# Patient Record
Sex: Male | Born: 1975
Health system: Southern US, Community
[De-identification: ages and names within clinical notes are randomized; demographics above are authoritative.]

## PROBLEM LIST (undated history)

## (undated) DIAGNOSIS — F410 Panic disorder [episodic paroxysmal anxiety] without agoraphobia: Secondary | ICD-10-CM

## (undated) HISTORY — DX: Panic disorder (episodic paroxysmal anxiety): F41.0

## (undated) HISTORY — PX: WISDOM TOOTH EXTRACTION: SHX21

---

## 2004-11-19 HISTORY — PX: COLONOSCOPY: SHX174

## 2010-10-05 ENCOUNTER — Ambulatory Visit: Payer: Self-pay | Admitting: Family Medicine

## 2010-10-05 DIAGNOSIS — F410 Panic disorder [episodic paroxysmal anxiety] without agoraphobia: Secondary | ICD-10-CM

## 2010-10-05 HISTORY — DX: Panic disorder (episodic paroxysmal anxiety): F41.0

## 2010-10-11 ENCOUNTER — Encounter: Payer: Self-pay | Admitting: Family Medicine

## 2010-10-15 LAB — CONVERTED CEMR LAB
ALT: 16 units/L (ref 0–53)
AST: 15 units/L (ref 0–37)
Alkaline Phosphatase: 49 units/L (ref 39–117)
Basophils Absolute: 0 10*3/uL (ref 0.0–0.1)
Basophils Relative: 0 % (ref 0–1)
Cholesterol: 158 mg/dL (ref 0–200)
Creatinine, Ser: 0.92 mg/dL (ref 0.40–1.50)
Eosinophils Absolute: 0.3 10*3/uL (ref 0.0–0.7)
Eosinophils Relative: 5 % (ref 0–5)
HCT: 43 % (ref 39.0–52.0)
Hemoglobin: 14.2 g/dL (ref 13.0–17.0)
LDL Cholesterol: 104 mg/dL — ABNORMAL HIGH (ref 0–99)
MCHC: 33 g/dL (ref 30.0–36.0)
Monocytes Absolute: 0.5 10*3/uL (ref 0.1–1.0)
Platelets: 253 10*3/uL (ref 150–400)
RDW: 13.4 % (ref 11.5–15.5)
Sodium: 138 meq/L (ref 135–145)
Total Bilirubin: 0.6 mg/dL (ref 0.3–1.2)
Total CHOL/HDL Ratio: 4.6
Total Protein: 7.1 g/dL (ref 6.0–8.3)
VLDL: 20 mg/dL (ref 0–40)

## 2010-12-19 NOTE — Assessment & Plan Note (Signed)
Summary: NOV: Get estab   Vital Signs:  Patient profile:   35 year old male Height:      75 inches Weight:      199 pounds Pulse rate:   81 / minute BP sitting:   126 / 66  (right arm) Cuff size:   regular  Vitals Entered By: Avon Gully CMA, Duncan Dull) (October 05, 2010 3:33 PM) CC: NP-est care   CC:  NP-est care.  History of Present Illness: Here to get estab.  he did mention that when he lived in the [place] he had an infection on the right side of his arm.  He was put on an antibiotic which he then had an allergic reaction to.  He was told he did have a penicillin allergy.  Initially he had a rash after another day or two he developed shortness of breath.  He literally felt like he was going to die.  Since that time for a missed a year he noted having similar episodes of shortness of breath at night when he would lay down to go to sleep.  He never had a rash.  And says it usually would resolve after a few minutes.  He did have some blood work done at that time which was all normal.  He says the last episode was in July of 2010.he thinks now that this may be anxiety.  Habits & Providers  Alcohol-Tobacco-Diet     Alcohol drinks/day: <1     Tobacco Status: never  Exercise-Depression-Behavior     Does Patient Exercise: no     STD Risk: never     Drug Use: no     Seat Belt Use: always  Past History:  Past Medical History: Colonoscopy 2008 for diarrhea  Past Surgical History: Wisdom Tooth removal.   Contraindications/Deferment of Procedures/Staging:    Test/Procedure: FLU VAX    Reason for deferment: patient declined   Family History: Lyphoma father, dx at age 50, died age 17 [place] with MI Grandparent with DM.   Social History: Production designer, theatre/television/film with [employer].  Married with 2 kids.   Never Smoked Alcohol use-yes, 2 per week.  Drug use-no Regular exercise-no 3 caffeinated drinks  Smoking Status:  never Does Patient Exercise:  no STD Risk:  never Drug Use:   no Seat Belt Use:  always  Review of Systems       No fever/sweats/weakness, unexplained weight loss/gain.  No vison changes.  No difficulty hearing/ringing in ears, hay fever/allergies.  No chest pain/discomfort, palpitations.  No Br lump/nipple discharge.  No cough/wheeze.  No blood in BM, nausea/vomiting/diarrhea.  No nighttime urination, leaking urine, unusual vaginal bleeding, discharge (penis or vagina).  No muscle/joint pain. No rash, change in mole.  No HA, memory loss.  No anxiety, sleep d/o, depression.  No easy bruising/bleeding, unexplained lump   Physical Exam  General:  Well-developed,well-nourished,in no acute distress; alert,appropriate and cooperative throughout examination Neck:  No deformities, masses, or tenderness noted. NO tM.  Lungs:  Normal respiratory effort, chest expands symmetrically. Lungs are clear to auscultation, no crackles or wheezes. Heart:  Normal rate and regular rhythm. S1 and S2 normal without gallop, murmur, click, rub or other extra sounds.   Impression & Recommendations:  Problem # 1:  LYMPHOMA, FAMILY HX (ICD-V16.7)  of note I do think the episodes that he mentioned are anxiety related.  They do not sound allergic.  these episodes resolved a year ago.  I do recommend screening labs and following up for  a complete physical.  Because does have a family history of lymphoma I do recommend a CBC as well.  He declined flu vaccine today.  He reports that his tetanus vaccine is up-to-date. Orders: T-CBC w/Diff ([id number])  Problem # 2:  PANIC ATTACK (ICD-300.01) of note I do think the episodes that he mentioned are anxiety related.  They do not sound allergic.  these episodes resolved a year ago. he is not currently having any problems with mood and anxiety.  Other Orders: T-Comprehensive Metabolic Panel [id number]) T-Lipid Profile [phone])  Patient Instructions: 1)  Feel free to schedule a physical anytime.    Orders Added: 1)   T-Comprehensive Metabolic Panel [[id number]] 2)  T-Lipid Profile [[id number]] 3)  T-CBC w/Diff [[id number]] 4)  New Patient Level II [99202]   Immunization History:  Tetanus/Td Immunization History:    Tetanus/Td:  historical (04/21/2007)   Immunization History:  Tetanus/Td Immunization History:    Tetanus/Td:  Historical (04/21/2007)   Immunization History:  Tetanus/Td Immunization History:    Tetanus/Td:  historical (04/21/2007)

## 2011-07-20 ENCOUNTER — Ambulatory Visit (INDEPENDENT_AMBULATORY_CARE_PROVIDER_SITE_OTHER): Payer: 59 | Admitting: Family Medicine

## 2011-07-20 ENCOUNTER — Encounter: Payer: Self-pay | Admitting: Family Medicine

## 2011-07-20 VITALS — BP 133/72 | HR 92 | Temp 98.1°F | Wt 194.0 lb

## 2011-07-20 DIAGNOSIS — R42 Dizziness and giddiness: Secondary | ICD-10-CM

## 2011-07-20 DIAGNOSIS — R002 Palpitations: Secondary | ICD-10-CM

## 2011-07-20 LAB — CBC WITH DIFFERENTIAL/PLATELET
Basophils Relative: 0 % (ref 0–1)
Eosinophils Absolute: 0.1 10*3/uL (ref 0.0–0.7)
Lymphs Abs: 1.3 10*3/uL (ref 0.7–4.0)
MCH: 28.1 pg (ref 26.0–34.0)
Neutro Abs: 3.1 10*3/uL (ref 1.7–7.7)
Neutrophils Relative %: 63 % (ref 43–77)
Platelets: 242 10*3/uL (ref 150–400)
RBC: 5.16 MIL/uL (ref 4.22–5.81)

## 2011-07-20 LAB — TSH: TSH: 0.869 u[IU]/mL (ref 0.350–4.500)

## 2011-07-20 NOTE — Progress Notes (Signed)
  Subjective:    Patient ID: Robert Sexton, male    DOB: 09/07/1976, 35 y.o.   MRN: [id number]  HPI 2 days of feeling fatigued, lightheaded and some palpitations.  Appears to be in episodes. Yesterday some HA and neck pain.  No fever.  Mybe some mild congestion. No ST.  Didn't take any meds. Woke with HA today. NO CP or SOB. Denies feeling dehydrated. Has been stressed at work and working long hours. When gets the heart racing will sometimes feel like she has to cough and then it will go away.     Review of Systems     Objective:   Physical Exam  Constitutional: He is oriented to person, place, and time. He appears well-developed and well-nourished.  HENT:  Head: Normocephalic and atraumatic.  Right Ear: External ear normal.  Left Ear: External ear normal.  Nose: Nose normal.  Mouth/Throat: Oropharynx is clear and moist.       TMs and canals are clear.   Eyes: Conjunctivae and EOM are normal. Pupils are equal, round, and reactive to light.  Neck: Neck supple. No thyromegaly present.  Cardiovascular: Normal rate and normal heart sounds.   Pulmonary/Chest: Effort normal and breath sounds normal.  Lymphadenopathy:    He has no cervical adenopathy.  Neurological: He is alert and oriented to person, place, and time.  Skin: Skin is warm and dry.  Psychiatric: He has a normal mood and affect.          Assessment & Plan:  Dizziness - Suspect maybe early viral illness. Call if suddenly gets worse or not better in one week. Will check CBC to rule out infection.  EKG shows rate of 89 bpm, NSR, no acute changes. Will rule out thyroid d/o as well. No fever.  Palpitations as well but EKG is reassuring.  Could be stress related. Does have hx of anxiety as well.

## 2011-07-20 NOTE — Patient Instructions (Signed)
We will call you with the lab results. 

## 2011-07-21 LAB — COMPLETE METABOLIC PANEL WITH GFR
ALT: 14 U/L (ref 0–53)
AST: 11 U/L (ref 0–37)
Chloride: 102 mEq/L (ref 96–112)
Creat: 1.02 mg/dL (ref 0.50–1.35)
Sodium: 140 mEq/L (ref 135–145)
Total Bilirubin: 0.8 mg/dL (ref 0.3–1.2)

## 2011-07-23 ENCOUNTER — Telehealth: Payer: Self-pay | Admitting: Family Medicine

## 2011-07-23 NOTE — Telephone Encounter (Signed)
Call pt: Blood count, thyroid and cmp are normal.

## 2011-07-24 ENCOUNTER — Encounter: Payer: Self-pay | Admitting: Family Medicine

## 2011-07-24 NOTE — Telephone Encounter (Signed)
Left message on vm with results  

## 2011-08-13 ENCOUNTER — Encounter: Payer: Self-pay | Admitting: Family Medicine

## 2013-03-11 ENCOUNTER — Encounter: Payer: Self-pay | Admitting: Family Medicine

## 2013-03-11 ENCOUNTER — Ambulatory Visit (INDEPENDENT_AMBULATORY_CARE_PROVIDER_SITE_OTHER): Payer: 59 | Admitting: Family Medicine

## 2013-03-11 VITALS — BP 113/70 | HR 85 | Temp 97.7°F | Wt 195.0 lb

## 2013-03-11 DIAGNOSIS — S161XXA Strain of muscle, fascia and tendon at neck level, initial encounter: Secondary | ICD-10-CM

## 2013-03-11 DIAGNOSIS — S139XXA Sprain of joints and ligaments of unspecified parts of neck, initial encounter: Secondary | ICD-10-CM

## 2013-03-11 NOTE — Progress Notes (Signed)
CC: Robert Sexton is a 37 y.o. male is here for Neck Pain   Subjective: HPI:  Patient presents with complaint of right neck pain. It is localized behind the right ear and radiates slightly down the back of the right neck. it is worse when lying on his right side , when looking quickly to the left and somewhat when looking down quickly. This started on Sunday of this week approximately 24-12 hours after working in the yard for an extended period of time doing strenuous activity. Symptoms are currently mild to moderate in severity, improves with ibuprofen and heat. Nothing else makes better or worse. He denies hearing loss, dizziness, fevers, chills, unintentional weight loss, trouble swallowing, night sweats, swollen lymph nodes, motor or sensory disturbances, nor midline neck tenderness. His father unfortunately passed away in his early 80s due to lymphoma.     Review Of Systems Outlined In HPI  Past Medical History  Diagnosis Date  . PANIC ATTACK 10/05/2010    Qualifier: Diagnosis of  By: Robert Arnold MD, Robert Sexton History  Problem Relation Age of Onset  . Atrial fibrillation Mother 52  . Lymphoma Father 40  . Cancer Father      History  Substance Use Topics  . Smoking status: Never Smoker   . Smokeless tobacco: Not on file  . Alcohol Use: 1.0 oz/week    2 drink(s) per week     Objective: Filed Vitals:   03/11/13 0832  BP: 113/70  Pulse: 85  Temp: 97.7 F (36.5 C)    General: Alert and Oriented, No Acute Distress HEENT: Pupils equal, round, reactive to light. Conjunctivae clear.  External ears unremarkable, canals clear with intact TMs with appropriate landmarks.  Middle ear appears open without effusion on the left and right middle ear with scant condensation. No mastoid tenderness to percussion. Pink inferior turbinates.  Moist mucous membranes, pharynx without inflammation nor lesions.  Neck supple without palpable lymphadenopathy nor abnormal masses. Lungs:  Clear comfortable work of breathing  MSK: Full range of motion and strength of the neck without midline cervical spinous process tenderness. Pain is reproduced with activation of right sternocleidomastoid muscle and with direct palpation of this muscle. Mental Status: No depression, anxiety, nor agitation. Skin: Warm and dry.  Assessment & Plan: Robert Sexton was seen today for neck pain.  Diagnoses and associated orders for this visit:  Cervical muscle strain, initial encounter  Other Orders - Multiple Vitamin (MULTIVITAMIN) tablet; Take 1 tablet by mouth daily.    Discuss cervical muscle strain diagnoses with patient and treatment including nonsteroidal anti-inflammatories and or muscle relaxers paired With range of motion exercises and continue heat therapy with or without massages. Patient declines prescription strength medication at this time will call if not improving closer to end of week.  Return if symptoms worsen or fail to improve.

## 2013-05-04 ENCOUNTER — Encounter: Payer: Self-pay | Admitting: Family Medicine

## 2013-05-04 ENCOUNTER — Ambulatory Visit (INDEPENDENT_AMBULATORY_CARE_PROVIDER_SITE_OTHER): Payer: 59 | Admitting: Family Medicine

## 2013-05-04 VITALS — BP 131/74 | HR 71 | Ht 77.0 in | Wt 196.0 lb

## 2013-05-04 DIAGNOSIS — Z131 Encounter for screening for diabetes mellitus: Secondary | ICD-10-CM

## 2013-05-04 DIAGNOSIS — Z13220 Encounter for screening for lipoid disorders: Secondary | ICD-10-CM

## 2013-05-04 DIAGNOSIS — Z130 Encounter for screening for diseases of the blood and blood-forming organs and certain disorders involving the immune mechanism: Secondary | ICD-10-CM

## 2013-05-04 DIAGNOSIS — Z0000 Encounter for general adult medical examination without abnormal findings: Secondary | ICD-10-CM

## 2013-05-04 DIAGNOSIS — L819 Disorder of pigmentation, unspecified: Secondary | ICD-10-CM

## 2013-05-04 LAB — BASIC METABOLIC PANEL WITH GFR
Calcium: 9.2 mg/dL (ref 8.4–10.5)
Creat: 0.94 mg/dL (ref 0.50–1.35)
GFR, Est African American: >89 mL/min
GFR, Est Non African American: >89 mL/min

## 2013-05-04 LAB — CBC WITH DIFFERENTIAL/PLATELET
Basophils Absolute: 0 10*3/uL (ref 0.0–0.1)
Eosinophils Relative: 4 % (ref 0–5)
Lymphocytes Relative: 33 % (ref 12–46)
Neutro Abs: 2.1 10*3/uL (ref 1.7–7.7)
Neutrophils Relative %: 50 % (ref 43–77)
Platelets: 225 10*3/uL (ref 150–400)
RDW: 13.3 % (ref 11.5–15.5)
WBC: 4.2 10*3/uL (ref 4.0–10.5)

## 2013-05-04 LAB — LIPID PANEL
HDL: 37 mg/dL — ABNORMAL LOW (ref >39)
LDL Cholesterol: 107 mg/dL — ABNORMAL HIGH (ref 0–99)
Triglycerides: 104 mg/dL (ref <150)
VLDL: 21 mg/dL (ref 0–40)

## 2013-05-04 NOTE — Progress Notes (Signed)
CC: Robert Sexton is a 37 y.o. male is here for Annual Exam   Subjective: HPI:  Colonoscopy: had normal colonosocpy five years ago due to "stomach issues" Prostate:no family hx prostate cancer, will start screening age 58  Influenza Vaccine: out of season Pneumovax: no indication yet Td/Tdap: Td given 2008 UTD Zoster: (Start 37 yo)  Patient presents for complete physical exam without any acute complaints  Over the past 2 weeks have you been bothered by: - Little interest or pleasure in doing things: no - Feeling down depressed or hopeless: no  No formal exercise routine, tries to stay active on a daily basis. He sticks to a varied diet with vegetables. Rare alcohol use, no tobacco use, no recreational drug use  Review of Systems - General ROS: negative for - chills, fever, night sweats, weight gain or weight loss Ophthalmic ROS: negative for - decreased vision Psychological ROS: negative for - anxiety or depression ENT ROS: negative for - hearing change, nasal congestion, tinnitus or allergies Hematological and Lymphatic ROS: negative for - bleeding problems, bruising or swollen lymph nodes Breast ROS: negative Respiratory ROS: no cough, shortness of breath, or wheezing Cardiovascular ROS: no chest pain or dyspnea on exertion Gastrointestinal ROS: no abdominal pain, change in bowel habits, or black or bloody stools Genito-Urinary ROS: negative for - genital discharge, genital ulcers, incontinence or abnormal bleeding from genitals Musculoskeletal ROS: negative for - joint pain or muscle pain Neurological ROS: negative for - headaches or memory loss Dermatological ROS: negative for lumps, mole changes, rash and skin lesion changes  Past Medical History  Diagnosis Date  . PANIC ATTACK 10/05/2010    Qualifier: Diagnosis of  By: Linford Arnold MD, Aurora Mask History  Problem Relation Age of Onset  . Atrial fibrillation Mother 85  . Lymphoma Father 53  . Cancer Father      History  Substance Use Topics  . Smoking status: Never Smoker   . Smokeless tobacco: Not on file  . Alcohol Use: 1.0 oz/week    2 drink(s) per week     Objective: Filed Vitals:   05/04/13 0839  BP: 131/74  Pulse: 71    General: No Acute Distress HEENT: Atraumatic, normocephalic, conjunctivae normal without scleral icterus.  No nasal discharge, hearing grossly intact, TMs with good landmarks bilaterally with no middle ear abnormalities, posterior pharynx clear without oral lesions. Neck: Supple, trachea midline, no cervical nor supraclavicular adenopathy. Pulmonary: Clear to auscultation bilaterally without wheezing, rhonchi, nor rales. Cardiac: Regular rate and rhythm.  No murmurs, rubs, nor gallops. No peripheral edema.  2+ peripheral pulses bilaterally. Abdomen: Bowel sounds normal.  No masses.  Non-tender without rebound.  Negative Murphy's sign. GU:  Bilateral descended non-tender testicles MSK: Grossly intact, no signs of weakness.  Full strength throughout upper and lower extremities.  Full ROM in upper and lower extremities.  No midline spinal tenderness. Neuro: Gait unremarkable, CN II-XII grossly intact.  C5-C6 Reflex 2/4 Bilaterally, L4 Reflex 2/4 Bilaterally.  Cerebellar function intact. Skin: No rashes. Pigmented lesion on the abdomen about the size of an eraser head, heterogeneous color Psych: Alert and oriented to person/place/time.  Thought process normal. No anxiety/depression. Assessment & Plan: [name] was seen today for annual exam.  Diagnoses and associated orders for this visit:  Annual physical exam  Lipid screening - Lipid panel  Screening for diabetes mellitus - BASIC METABOLIC PANEL WITH GFR  Screening for deficiency anemia - CBC w/Diff  Pigmented skin lesion  Healthy lifestyle interventions including but limited to regular exercise, a healthy low fat diet, moderation of salt intake, the dangers of tobacco/alcohol/recreational drug use,  nutrition supplementation, and accident avoidance were discussed with the patient and a handout was provided for future reference. Discussed self testicular exam  Screen for hyperlipidemia, diabetes, given history of lymphoma in the family checking blood lines.    Return in about 4 weeks (around 06/01/2013) for mole biopsy.

## 2013-05-04 NOTE — Patient Instructions (Addendum)
Self-Exam Of The Testicles Young men ages 80-35 are the ones who most commonly get cancer of the testicles. About 300 young men die of this disease each year. Testicular cancer does occur in middle-aged or older men but to a lesser extent. This cancer can almost always be cured if it is found before it gets bad and if it is treated. WORDS TO KNOW:  Testicles are the 2 egg-shaped glands that make hormones and sperm in men.  The scrotum is the skin around testicles.  Epididymis is the rope-like part that is behind and above each testis. It collects sperm made by the testis. WHICH MEN ARE AT HIGH RISK FOR CANCER OF THE TESTICLES?  Men between ages 70 to 52.  Men who are Caucasian.  Men who were born with a testicle that had not moved down into the scrotum.  Men whose testicles have gotten smaller because of an infection.  Men whose fathers or brothers have had cancer of the testicles. SYMPTOMS OF TESTICULAR CANCER  A painless swelling in one of your testicles.  A hard lump. Some lumps may be an infection.  A heavy feeling in your testicles.  An ache in your lower belly (abdomen) or groin. HOW OFTEN SHOULD I CHECK MY TESTICLES? You should check your testicles every month. HOW SHOULD I DO THIS CHECK? It is best to check your testicles right after a warm shower or bath.  Look at your testicles for any swelling. You may need to use a mirror.  Use both your hands to roll each testicle between your thumb and fingers. Feel for any lumps or changes in the size of the testicle. Press firmly. You may find that one testicle is a little bigger than the other. This is normal.  Next, check the epididymis on each testicle. This is the part where most testicular cancers happen. It is normal for the epididymis to feel soft and uneven. When you get used to how your epididymis feels, you will be able to tell if there is any change.  WHAT IF I FIND ANY SWELLINGS OR LUMPS?  Call your doctor. Some lumps  may be an infection. If the lump or swelling is a cancer, it can be treated before it gets worse.  Document Released: 02/01/2009 Document Revised: 01/28/2012 Document Reviewed: 02/01/2009 [facility] Patient Information 2014 [place], [place].   Dr. Genelle Bal General Advice Following Your Complete Physical Exam  The Benefits of Regular Exercise: Unless you suffer from an uncontrolled cardiovascular condition, studies strongly suggest that regular exercise and physical activity will add to both the quality and length of your life.  The World Health Organization recommends 150 minutes of moderate intensity aerobic activity every week.  This is best split over 3-4 days a week, and can be as simple as a brisk walk for just over 35 minutes "most days of the week".  This type of exercise has been shown to lower LDL-Cholesterol, lower average blood sugars, lower blood pressure, lower cardiovascular disease risk, improve memory, and increase one's overall sense of wellbeing.  The addition of anaerobic (or "strength training") exercises offers additional benefits including but not limited to increased metabolism, prevention of osteoporosis, and improved overall cholesterol levels.  How Can I Strive For A Low-Fat Diet?: Current guidelines recommend that 25-35 percent of your daily energy (food) intake should come from fats.  One might ask how can this be achieved without having to dissect each meal on a daily basis?  Switch to skim or 1%  milk instead of whole milk.  Focus on lean meats such as ground [place], fresh fish, baked chicken, and lean cuts of beef as your source of dietary protein.  Consume less than 300mg /day of dietary cholesterol.  Limit trans fatty acid consumption primarily by limiting synthetic trans fats such as partially hydrogenated oils (Ex: fried fast foods).  Focus efforts on reducing your intake of "solid" fats (Ex: Butter).  Substitute olive or vegetable oil for solid fats where  possible.  Moderation of Salt Intake: Provided you don't carry a diagnosis of congestive heart failure nor renal failure, I recommend a daily allowance of no more than 2300 mg of salt (sodium).  Keeping under this daily goal is associated with a decreased risk of cardiovascular events, creeping above it can lead to elevated blood pressures and increases your risk of cardiovascular events.  Milligrams (mg) of salt is listed on all nutrition labels, and your daily intake can add up faster than you think.  Most canned and frozen dinners can pack in over half your daily salt allowance in one meal.    Lifestyle Health Risks: Certain lifestyle choices carry specific health risks.  As you may already know, tobacco use has been associated with increasing one's risk of cardiovascular disease, pulmonary disease, numerous cancers, among many other issues.  What you may not know is that there are medications and nicotine replacement strategies that can more than double your chances of successfully quitting.  I would be thrilled to help manage your quitting strategy if you currently use tobacco products.  When it comes to alcohol use, I've yet to find an "ideal" daily allowance.  Provided an individual does not have a medical condition that is exacerbated by alcohol consumption, general guidelines determine "safe drinking" as no more than two standard drinks for a man or no more than one standard drink for a male per day.  However, much debate still exists on whether any amount of alcohol consumption is technically "safe".  My general advice, keep alcohol consumption to a minimum for general health promotion.  If you or others believe that alcohol, tobacco, or recreational drug use is interfering with your life, I would be happy to provide confidential counseling regarding treatment options.  General "Over The Counter" Nutrition Advice: Postmenopausal women should aim for a daily calcium intake of 1200 mg, however a  significant portion of this might already be provided by diets including milk, yogurt, cheese, and other dairy products.  Vitamin D has been shown to help preserve bone density, prevent fatigue, and has even been shown to help reduce falls in the elderly.  Ensuring a daily intake of 800 Units of Vitamin D is a good place to start to enjoy the above benefits, we can easily check your Vitamin D level to see if you'd potentially benefit from supplementation beyond 800 Units a day.  Folic Acid intake should be of particular concern to women of childbearing age.  Daily consumption of 400-800 mcg of Folic Acid is recommended to minimize the chance of spinal cord defects in a fetus should pregnancy occur.    For many adults, accidents still remain one of the most common culprits when it comes to cause of death.  Some of the simplest but most effective preventitive habits you can adopt include regular seatbelt use, proper helmet use, securing firearms, and regularly testing your smoke and carbon monoxide detectors.  Mozell Hardacre [facility] DO Med [facility] [address], [address] [place], [place] [address]  Phone: [phone]

## 2013-06-23 ENCOUNTER — Encounter: Payer: Self-pay | Admitting: Family Medicine

## 2013-06-23 ENCOUNTER — Ambulatory Visit (INDEPENDENT_AMBULATORY_CARE_PROVIDER_SITE_OTHER): Payer: 59 | Admitting: Family Medicine

## 2013-06-23 VITALS — BP 131/66 | HR 68 | Wt 197.0 lb

## 2013-06-23 DIAGNOSIS — L819 Disorder of pigmentation, unspecified: Secondary | ICD-10-CM

## 2013-06-23 NOTE — Progress Notes (Signed)
CC: Robert Sexton is a 37 y.o. male is here for Biopsy   Subjective: HPI:  Patient complains of a pigmented skin lesion on the midline of the abdomen has been present for 2-3 years he believes it's slowly growing he has no personal history or family history of skin cancer. He denies unintentional weight gain, night sweats, myalgias, or pulmonary complaints.   Review Of Systems Outlined In HPI  Past Medical History  Diagnosis Date  . PANIC ATTACK 10/05/2010    Qualifier: Diagnosis of  By: Linford Arnold MD, Aurora Mask History  Problem Relation Age of Onset  . Atrial fibrillation Mother 63  . Lymphoma Father 28  . Cancer Father      History  Substance Use Topics  . Smoking status: Never Smoker   . Smokeless tobacco: Not on file  . Alcohol Use: 1.0 oz/week    2 drink(s) per week     Objective: Filed Vitals:   06/23/13 0832  BP: 131/66  Pulse: 68    Vital signs reviewed. General: Alert and Oriented, No Acute Distress HEENT: Pupils equal, round, reactive to light. Conjunctivae clear.  External ears unremarkable.  Moist mucous membranes. Lungs: Clear and comfortable work of breathing, speaking in full sentences without accessory muscle use. Cardiac: Regular rate and rhythm.  Neuro: CN II-XII grossly intact, gait normal. Extremities: No peripheral edema.  Strong peripheral pulses.  Mental Status: No depression, anxiety, nor agitation. Logical though process. Skin: Warm and dry. Just above the navel there is a 6 mm homogenously colored pigmented skin lesion with ill-defined borders that is symmetric.  Assessment & Plan: Robert Sexton was seen today for biopsy.  Diagnoses and associated orders for this visit:  Pigmented skin lesion    Punch Biopsy Procedure Note  Pre-operative Diagnosis: Suspicious lesion  Post-operative Diagnosis: same  Locations:above navel  Indications: rule out melanoma  Anesthesia: 1% lidocaine with epinephrine  Procedure Details   History of allergy to iodine: no Patient informed of the risks (including bleeding and infection) and benefits of the  procedure and Verbal informed consent obtained.  The lesion and surrounding area was given a sterile prep using betadyne and draped in the usual sterile fashion. The skin was then stretched perpendicular to the skin tension lines and the lesion removed using the 6mm punch. The resulting ellipse was then closed. The wound was closed with Figure 8 Nylon 3-0.  a sterile dressing applied. The specimen was sent for pathologic examination. The patient tolerated the procedure well.  EBL: 1 ml  Findings: unremarkable  Condition: Stable  Complications: none.  Plan: 1. Instructed to keep the wound dry and covered for 24-48h and clean thereafter. 2. Warning signs of infection were reviewed.   3. Recommended that the patient use OTC analgesics as needed for pain.  4. Return for suture removal in 1 week.  Return in about 1 week (around 06/30/2013).

## 2013-06-23 NOTE — Addendum Note (Signed)
Addended by: Wyline Beady on: 06/23/2013 09:11 AM   Modules accepted: Orders

## 2013-06-25 ENCOUNTER — Telehealth: Payer: Self-pay | Admitting: Family Medicine

## 2013-06-25 DIAGNOSIS — D239 Other benign neoplasm of skin, unspecified: Secondary | ICD-10-CM | POA: Insufficient documentation

## 2013-06-25 NOTE — Telephone Encounter (Signed)
Left message to call back  

## 2013-06-25 NOTE — Telephone Encounter (Signed)
[name], Will you please let Robert Sexton know that the pathologist reports that his biopsy is not melanoma but is a collection of cells that have the potential to evolve into a melanoma.  The pathologist has recommended complete excision of all the pigmented tissue from the biopsy site.  I've placed a non-emergent referral to our dermatology colleagues for a complete excision.  I doubt much more skin needs to be removed but a dermatologist will provide him with a better cosmetic result.  Still come back sometime mid-late next week for suture removal unless he sees them first.

## 2013-06-25 NOTE — Telephone Encounter (Signed)
Pt.notified

## 2013-06-30 ENCOUNTER — Ambulatory Visit (INDEPENDENT_AMBULATORY_CARE_PROVIDER_SITE_OTHER): Payer: 59 | Admitting: Family Medicine

## 2013-06-30 ENCOUNTER — Encounter: Payer: Self-pay | Admitting: Family Medicine

## 2013-06-30 VITALS — BP 114/69 | HR 72 | Wt 198.0 lb

## 2013-06-30 DIAGNOSIS — D239 Other benign neoplasm of skin, unspecified: Secondary | ICD-10-CM

## 2013-06-30 DIAGNOSIS — Z4802 Encounter for removal of sutures: Secondary | ICD-10-CM

## 2013-06-30 NOTE — Progress Notes (Signed)
CC: Jawara Latorre is a 37 y.o. male is here for Suture / Staple Removal   Subjective: HPI:  Patient returns 8 days out after removal of dysplastic nevus he has had no bleeding discharge or discomfort at the site of the punch biopsy. He denies fevers, chills or any complaints today.   Review Of Systems Outlined In HPI  Past Medical History  Diagnosis Date  . PANIC ATTACK 10/05/2010    Qualifier: Diagnosis of  By: Linford Arnold MD, [place] [facility] History  Problem Relation Age of Onset  . Atrial fibrillation Mother 78  . Lymphoma Father 53  . Cancer Father      History  Substance Use Topics  . Smoking status: Never Smoker   . Smokeless tobacco: Not on file  . Alcohol Use: 1.0 oz/week    2 drink(s) per week     Objective: Filed Vitals:   06/30/13 0819  BP: 114/69  Pulse: 72    On the abdomen Suture site is clean dry and intact the 2 edges are well approximated there is no discharge or tenderness  Assessment & Plan: Patrick was seen today for suture / staple removal.  Diagnoses and associated orders for this visit:  Visit for suture removal  Dysplastic nevus    Single figure-of-eight stitch was easily removed without complications, patient confirms complete excision visit is scheduled in early September he was given pathology report to share at that visit just in case it was not sent with referral.  Return if symptoms worsen or fail to improve.

## 2013-07-23 ENCOUNTER — Other Ambulatory Visit: Payer: Self-pay | Admitting: Dermatology

## 2013-08-05 ENCOUNTER — Encounter: Payer: Self-pay | Admitting: Family Medicine

## 2014-07-20 ENCOUNTER — Ambulatory Visit (INDEPENDENT_AMBULATORY_CARE_PROVIDER_SITE_OTHER): Payer: 59 | Admitting: Family Medicine

## 2014-07-20 ENCOUNTER — Encounter: Payer: Self-pay | Admitting: Family Medicine

## 2014-07-20 VITALS — BP 125/72 | HR 81 | Ht 75.0 in | Wt 197.0 lb

## 2014-07-20 DIAGNOSIS — Z0000 Encounter for general adult medical examination without abnormal findings: Secondary | ICD-10-CM

## 2014-07-20 DIAGNOSIS — R002 Palpitations: Secondary | ICD-10-CM | POA: Insufficient documentation

## 2014-07-20 DIAGNOSIS — Z23 Encounter for immunization: Secondary | ICD-10-CM

## 2014-07-20 NOTE — Progress Notes (Signed)
Subjective:    Patient ID: Robert Sexton, male    DOB: [birth year], 38 y.o.   MRN: [id number]  HPI Here for CPE today.  No specific concerns today.   He does complain of some occasional palpitations or skipping of heartbeat. He says there is no certain time of day. Has been in the evening or at work. Does not necessarily with activity. He does notice a little bit more when he is stressed. He says usually a seconds and sometimes it can last up to 10 minutes but that's not often. He usually experiences his symptoms about 2-3 times per week. He denies any dizziness/lightheadedness, nausea vomiting or chest pain with this. He says his mom has a history of palpitations.   Review of Systems Comprehensive ROS is neg.    BP 125/72  Pulse 81  Ht 6\' 3"  (1.905 m)  Wt 197 lb (89.359 kg)  BMI 24.62 kg/m2    Allergies  Allergen Reactions  . Penicillins Anaphylaxis    Past Medical History  Diagnosis Date  . PANIC ATTACK 10/05/2010    Qualifier: Diagnosis of  By: Madilyn Fireman MD, Barnetta Chapel      Past Surgical History  Procedure Laterality Date  . Wisdom tooth extraction      History   Social History  . Marital Status: Married    Spouse Name: N/A    Number of Children: N/A  . Years of Education: N/A   Occupational History  . Not on file.   Social History Main Topics  . Smoking status: Never Smoker   . Smokeless tobacco: Not on file  . Alcohol Use: 1.0 oz/week    2 drink(s) per week  . Drug Use: No  . Sexual Activity: Not on file   Other Topics Concern  . Not on file   Social History Narrative  . No narrative on file    Family History  Problem Relation Age of Onset  . Atrial fibrillation Mother 64  . Lymphoma Father 71  . Cancer Father     Outpatient Encounter Prescriptions as of 07/20/2014  Medication Sig  . Multiple Vitamin (MULTIVITAMIN) tablet Take 1 tablet by mouth daily.          Objective:   Physical Exam  Constitutional: He is oriented to person, place,  and time. He appears well-developed and well-nourished.  HENT:  Head: Normocephalic and atraumatic.  Right Ear: External ear normal.  Left Ear: External ear normal.  Nose: Nose normal.  Mouth/Throat: Oropharynx is clear and moist.  Eyes: Conjunctivae and EOM are normal. Pupils are equal, round, and reactive to light.  Neck: Normal range of motion. Neck supple. No thyromegaly present.  Cardiovascular: Normal rate, regular rhythm, normal heart sounds and intact distal pulses.   Pulmonary/Chest: Effort normal and breath sounds normal.  Abdominal: Soft. Bowel sounds are normal. He exhibits no distension and no mass. There is no tenderness. There is no rebound and no guarding.  Musculoskeletal: Normal range of motion.  Lymphadenopathy:    He has no cervical adenopathy.  Neurological: He is alert and oriented to person, place, and time. He has normal reflexes.  Skin: Skin is warm and dry.  Psychiatric: He has a normal mood and affect. His behavior is normal. Judgment and thought content normal.          Assessment & Plan:  CPE  Keep up a regular exercise program and make sure you are eating a healthy diet Try to eat 4 servings of dairy a  day, or if you are lactose intolerant take a calcium with vitamin D daily.  Your vaccines are up to date.  Check CBC bc of father's history of lymphoma.   Palpitations- will check CBC, TSH.  Will get EKG. Had similar sx 3 years ago. We discussed possibly getting a Holter monitor to evaluate his symptoms to confirm if it could possibly be PVCs. It sounds most consistent with this diagnosis. We could certainly consider a trial of beta blocker help control his symptoms. He says he'll be traveling a lot over the next month this is likely coming back in October to schedule the Holter monitor.  EKG shows rate of 80 beats per minute, normal sinus rhythm with no acute changes and normal axis. Questionable Q wave in lead V2. Otherwise no significant changes from  2012.   Flu shot given

## 2014-07-20 NOTE — Patient Instructions (Signed)
Please call me when you get back from traveling and we can set up a Holter monitor for you. Keep up a regular exercise program and make sure you are eating a healthy diet Try to eat 4 servings of dairy a day, or if you are lactose intolerant take a calcium with vitamin D daily.  Your vaccines are up to date.

## 2014-07-21 LAB — COMPLETE METABOLIC PANEL WITH GFR
ALBUMIN: 4.4 g/dL (ref 3.5–5.2)
ALK PHOS: 47 U/L (ref 39–117)
ALT: 26 U/L (ref 0–53)
AST: 20 U/L (ref 0–37)
BUN: 10 mg/dL (ref 6–23)
CO2: 29 mEq/L (ref 19–32)
Calcium: 9.5 mg/dL (ref 8.4–10.5)
Chloride: 102 mEq/L (ref 96–112)
Creat: 0.97 mg/dL (ref 0.50–1.35)
GFR, Est African American: >89 mL/min
GFR, Est Non African American: >89 mL/min
GLUCOSE: 82 mg/dL (ref 70–99)
POTASSIUM: 4.7 meq/L (ref 3.5–5.3)
SODIUM: 138 meq/L (ref 135–145)
TOTAL PROTEIN: 7.3 g/dL (ref 6.0–8.3)
Total Bilirubin: 0.6 mg/dL (ref 0.2–1.2)

## 2014-07-21 LAB — LIPID PANEL
CHOL/HDL RATIO: 3.8 ratio
Cholesterol: 151 mg/dL (ref 0–200)
HDL: 40 mg/dL (ref >39)
LDL Cholesterol: 94 mg/dL (ref 0–99)
Triglycerides: 87 mg/dL (ref <150)
VLDL: 17 mg/dL (ref 0–40)

## 2014-07-21 LAB — CBC WITH DIFFERENTIAL/PLATELET
BASOS ABS: 0 10*3/uL (ref 0.0–0.1)
BASOS PCT: 1 % (ref 0–1)
Eosinophils Absolute: 0.2 10*3/uL (ref 0.0–0.7)
Eosinophils Relative: 4 % (ref 0–5)
HCT: 43.5 % (ref 39.0–52.0)
HEMOGLOBIN: 14.5 g/dL (ref 13.0–17.0)
Lymphocytes Relative: 28 % (ref 12–46)
Lymphs Abs: 1.2 10*3/uL (ref 0.7–4.0)
MCH: 28.3 pg (ref 26.0–34.0)
MCHC: 33.3 g/dL (ref 30.0–36.0)
MCV: 84.8 fL (ref 78.0–100.0)
MONOS PCT: 10 % (ref 3–12)
Monocytes Absolute: 0.4 10*3/uL (ref 0.1–1.0)
NEUTROS ABS: 2.5 10*3/uL (ref 1.7–7.7)
NEUTROS PCT: 57 % (ref 43–77)
Platelets: 257 10*3/uL (ref 150–400)
RBC: 5.13 MIL/uL (ref 4.22–5.81)
RDW: 13.4 % (ref 11.5–15.5)
WBC: 4.4 10*3/uL (ref 4.0–10.5)

## 2014-07-21 LAB — TSH: TSH: 1.266 u[IU]/mL (ref 0.350–4.500)

## 2014-07-21 NOTE — Progress Notes (Signed)
Quick Note:  All labs are normal. ______ 

## 2014-07-22 ENCOUNTER — Telehealth: Payer: Self-pay | Admitting: *Deleted

## 2014-07-22 NOTE — Telephone Encounter (Signed)
Pt's biometric screening form completed, faxed and scanned.Robert Sexton [place]

## 2015-01-03 ENCOUNTER — Ambulatory Visit (INDEPENDENT_AMBULATORY_CARE_PROVIDER_SITE_OTHER): Payer: 59 | Admitting: Family Medicine

## 2015-01-03 ENCOUNTER — Encounter: Payer: Self-pay | Admitting: Family Medicine

## 2015-01-03 VITALS — BP 126/72 | HR 75 | Wt 201.0 lb

## 2015-01-03 DIAGNOSIS — R002 Palpitations: Secondary | ICD-10-CM

## 2015-01-03 NOTE — Progress Notes (Signed)
   Subjective:    Patient ID: Robert Sexton, male    DOB: 1976/03/11, 39 y.o.   MRN: [id number]  HPI C/o Palpitations - says can last seconds. Sometimes coughing helps it go away.  Says can happen at any time.  Stress does seem to trigger it but can happen at any time.  Now happening daily.  Back in September was only happening 2-3 times a week. Does feels like he has high anxiety.  He said years ago he actually developed pain attacks when he went overseas. He says he really hasn't had any palms with it until about a week ago but he has been under a lot of stress at his job. No chest pain or breathing problems.  Review of Systems     Objective:   Physical Exam  Constitutional: He is oriented to person, place, and time. He appears well-developed and well-nourished.  HENT:  Head: Normocephalic and atraumatic.  Cardiovascular: Normal rate, regular rhythm and normal heart sounds.   Pulmonary/Chest: Effort normal and breath sounds normal.  Neurological: He is alert and oriented to person, place, and time.  Skin: Skin is warm and dry.  Psychiatric: He has a normal mood and affect. His behavior is normal.          Assessment & Plan:   Palpitations-recommend heart monitor for further evaluation. We did an EKG and lab work back in the fall to rule out any abnormalities that may be causing his symptoms. It does sound like the frequency has increased. We'll call results once available.

## 2015-01-11 ENCOUNTER — Encounter: Payer: Self-pay | Admitting: *Deleted

## 2015-01-11 ENCOUNTER — Encounter (INDEPENDENT_AMBULATORY_CARE_PROVIDER_SITE_OTHER): Payer: 59

## 2015-01-11 DIAGNOSIS — R002 Palpitations: Secondary | ICD-10-CM | POA: Diagnosis not present

## 2015-01-11 NOTE — Progress Notes (Signed)
Patient ID: Robert Sexton, male   DOB: Jan 20, 1976, 39 y.o.   MRN: [id number] [employer] 30 day cardiac event monitor applied to patient.

## 2015-02-21 ENCOUNTER — Telehealth: Payer: Self-pay | Admitting: Emergency Medicine

## 2015-09-19 ENCOUNTER — Ambulatory Visit (INDEPENDENT_AMBULATORY_CARE_PROVIDER_SITE_OTHER): Payer: 59 | Admitting: Family Medicine

## 2015-09-19 ENCOUNTER — Encounter: Payer: Self-pay | Admitting: Family Medicine

## 2015-09-19 VITALS — BP 112/65 | HR 87 | Temp 98.5°F | Ht 75.0 in | Wt 198.0 lb

## 2015-09-19 DIAGNOSIS — Z0000 Encounter for general adult medical examination without abnormal findings: Secondary | ICD-10-CM

## 2015-09-19 DIAGNOSIS — J069 Acute upper respiratory infection, unspecified: Secondary | ICD-10-CM

## 2015-09-19 DIAGNOSIS — Z114 Encounter for screening for human immunodeficiency virus [HIV]: Secondary | ICD-10-CM | POA: Diagnosis not present

## 2015-09-19 DIAGNOSIS — Z23 Encounter for immunization: Secondary | ICD-10-CM

## 2015-09-19 DIAGNOSIS — R002 Palpitations: Secondary | ICD-10-CM | POA: Diagnosis not present

## 2015-09-19 LAB — CBC WITH DIFFERENTIAL/PLATELET
BASOS ABS: 0 10*3/uL (ref 0.0–0.1)
BASOS PCT: 0 % (ref 0–1)
EOS ABS: 0.1 10*3/uL (ref 0.0–0.7)
Eosinophils Relative: 2 % (ref 0–5)
HCT: 41.6 % (ref 39.0–52.0)
Hemoglobin: 13.8 g/dL (ref 13.0–17.0)
LYMPHS PCT: 23 % (ref 12–46)
Lymphs Abs: 1.5 10*3/uL (ref 0.7–4.0)
MCH: 27.8 pg (ref 26.0–34.0)
MCHC: 33.2 g/dL (ref 30.0–36.0)
MCV: 83.7 fL (ref 78.0–100.0)
MPV: 9.3 fL (ref 8.6–12.4)
Monocytes Absolute: 0.7 10*3/uL (ref 0.1–1.0)
Monocytes Relative: 11 % (ref 3–12)
NEUTROS ABS: 4.2 10*3/uL (ref 1.7–7.7)
Neutrophils Relative %: 64 % (ref 43–77)
PLATELETS: 281 10*3/uL (ref 150–400)
RBC: 4.97 MIL/uL (ref 4.22–5.81)
RDW: 13.1 % (ref 11.5–15.5)
WBC: 6.6 10*3/uL (ref 4.0–10.5)

## 2015-09-19 LAB — COMPLETE METABOLIC PANEL WITH GFR
ALBUMIN: 4.3 g/dL (ref 3.6–5.1)
ALK PHOS: 60 U/L (ref 40–115)
ALT: 16 U/L (ref 9–46)
AST: 14 U/L (ref 10–40)
BILIRUBIN TOTAL: 0.6 mg/dL (ref 0.2–1.2)
BUN: 9 mg/dL (ref 7–25)
CALCIUM: 9.3 mg/dL (ref 8.6–10.3)
CO2: 28 mmol/L (ref 20–31)
Chloride: 101 mmol/L (ref 98–110)
Creat: 0.91 mg/dL (ref 0.60–1.35)
GFR, Est African American: >89 mL/min (ref >=60)
GLUCOSE: 85 mg/dL (ref 65–99)
Potassium: 4.5 mmol/L (ref 3.5–5.3)
SODIUM: 139 mmol/L (ref 135–146)
TOTAL PROTEIN: 7.1 g/dL (ref 6.1–8.1)

## 2015-09-19 LAB — LIPID PANEL
Cholesterol: 146 mg/dL (ref 125–200)
HDL: 31 mg/dL — AB (ref >=40)
LDL Cholesterol: 101 mg/dL (ref <130)
Total CHOL/HDL Ratio: 4.7 Ratio (ref <=5.0)
Triglycerides: 69 mg/dL (ref <150)
VLDL: 14 mg/dL (ref <30)

## 2015-09-19 NOTE — Progress Notes (Signed)
Subjective:    Patient ID: Robert Sexton, male    DOB: 01-08-1976, 39 y.o.   MRN: [id number]  HPI Here for complete physical. He has no specific complaints.  He is doing well. Has had some stress with work as he has been traveling more.  He has had a cold for one week.  Cough and congestion. No fever. Using sudafed and decongestant and has been feeling some better the last 3 days.   Palpitations - he anatomy that we had discussed using the beta blocker before. Was noted can be used more as needed instead of daily and wanted at this was a possible option.  He just takes a multivitamin daily.  Review of Systems  Comprehensive ROS is negative.   There were no vitals taken for this visit.    Allergies  Allergen Reactions  . Penicillins Anaphylaxis    Past Medical History  Diagnosis Date  . PANIC ATTACK 10/05/2010    Qualifier: Diagnosis of  By: Madilyn Fireman MD, Barnetta Chapel      Past Surgical History  Procedure Laterality Date  . Wisdom tooth extraction      Social History   Social History  . Marital Status: Married    Spouse Name: N/A  . Number of Children: 2  . Years of Education: N/A   Occupational History  . Not on file.   Social History Main Topics  . Smoking status: Never Smoker   . Smokeless tobacco: Not on file  . Alcohol Use: 1.0 oz/week    2 drink(s) per week  . Drug Use: No  . Sexual Activity: Not on file   Other Topics Concern  . Not on file   Social History Narrative   Play bball sometimes for exercise. Has 2 sons      Family History  Problem Relation Age of Onset  . Atrial fibrillation Mother 109  . Lymphoma Father 19    Outpatient Encounter Prescriptions as of 09/19/2015  Medication Sig  . Multiple Vitamin (MULTIVITAMIN) tablet Take 1 tablet by mouth daily.   No facility-administered encounter medications on file as of 09/19/2015.           Objective:   Physical Exam  Constitutional: He is oriented to person, place, and time. He appears  well-developed and well-nourished.  HENT:  Head: Normocephalic and atraumatic.  Right Ear: External ear normal.  Left Ear: External ear normal.  Nose: Nose normal.  Mouth/Throat: Oropharynx is clear and moist.  Eyes: Conjunctivae and EOM are normal. Pupils are equal, round, and reactive to light.  Neck: Normal range of motion. Neck supple. No thyromegaly present.  Cardiovascular: Normal rate, regular rhythm, normal heart sounds and intact distal pulses.   Pulmonary/Chest: Effort normal and breath sounds normal.  Abdominal: Soft. Bowel sounds are normal. He exhibits no distension and no mass. There is no tenderness. There is no rebound and no guarding.  Musculoskeletal: Normal range of motion.  Lymphadenopathy:    He has no cervical adenopathy.  Neurological: He is alert and oriented to person, place, and time. He has normal reflexes.  Skin: Skin is warm and dry.  Psychiatric: He has a normal mood and affect. His behavior is normal. Judgment and thought content normal.          Assessment & Plan:  CPE -  Keep up a regular exercise program and make sure you are eating a healthy diet Try to eat 4 servings of dairy a day, or if you are lactose intolerant  take a calcium with vitamin D daily.  Your vaccines are up to date.   Palpitations  - consider Betablocker. Can use PRN if would like. He would like to think about it.   URI - likely viral. He is already feeling some better.  Cal if getting worse or not better in one week.

## 2015-09-20 LAB — HIV ANTIBODY (ROUTINE TESTING W REFLEX): HIV: NONREACTIVE

## 2015-09-29 ENCOUNTER — Telehealth: Payer: Self-pay | Admitting: *Deleted

## 2015-09-29 MED ORDER — METOPROLOL TARTRATE 25 MG PO TABS: 12.5000 mg | ORAL_TABLET | Freq: Two times a day (BID) | ORAL | Status: DC | PRN

## 2015-09-29 NOTE — Telephone Encounter (Signed)
Pt called and stated that as per last OV pcp would start beta blocker to be taken PRN for palpitations. Will fwd to pcp for f/u. He uses [facility] [address] in [place].Robert Sexton [place]

## 2015-09-29 NOTE — Telephone Encounter (Signed)
Okay, prescriptions sent for metoprolol to [facility].

## 2016-08-27 ENCOUNTER — Telehealth: Payer: Self-pay

## 2016-08-27 MED ORDER — METOPROLOL TARTRATE 25 MG PO TABS: 12.5000 mg | ORAL_TABLET | Freq: Two times a day (BID) | ORAL | 1 refills | Status: DC | PRN

## 2016-08-27 NOTE — Telephone Encounter (Signed)
Ok rx sent. Remind him due for physical again soon.

## 2016-08-27 NOTE — Telephone Encounter (Signed)
Pt reports when he seen last October for his physical he mentioned he'd been having some palpitations. He said you offered him a beta blocker which he declined at that time.  He now wanting to know is that offer still on the table. Please advise.

## 2016-08-28 NOTE — Telephone Encounter (Signed)
Pt.notified

## 2016-09-25 ENCOUNTER — Ambulatory Visit (INDEPENDENT_AMBULATORY_CARE_PROVIDER_SITE_OTHER): Payer: 59 | Admitting: Family Medicine

## 2016-09-25 ENCOUNTER — Encounter: Payer: Self-pay | Admitting: Family Medicine

## 2016-09-25 VITALS — BP 107/57 | HR 77 | Wt 199.0 lb

## 2016-09-25 DIAGNOSIS — Z0000 Encounter for general adult medical examination without abnormal findings: Secondary | ICD-10-CM | POA: Diagnosis not present

## 2016-09-25 DIAGNOSIS — Z23 Encounter for immunization: Secondary | ICD-10-CM

## 2016-09-25 LAB — COMPLETE METABOLIC PANEL WITH GFR
ALBUMIN: 4.3 g/dL (ref 3.6–5.1)
ALK PHOS: 41 U/L (ref 40–115)
ALT: 17 U/L (ref 9–46)
AST: 15 U/L (ref 10–40)
BILIRUBIN TOTAL: 0.5 mg/dL (ref 0.2–1.2)
BUN: 12 mg/dL (ref 7–25)
CO2: 27 mmol/L (ref 20–31)
Calcium: 9.2 mg/dL (ref 8.6–10.3)
Chloride: 104 mmol/L (ref 98–110)
Creat: 0.87 mg/dL (ref 0.60–1.35)
GFR, Est African American: >89 mL/min (ref >=60)
GLUCOSE: 89 mg/dL (ref 65–99)
Potassium: 4.5 mmol/L (ref 3.5–5.3)
SODIUM: 139 mmol/L (ref 135–146)
TOTAL PROTEIN: 6.8 g/dL (ref 6.1–8.1)

## 2016-09-25 LAB — LIPID PANEL
Cholesterol: 157 mg/dL (ref <200)
HDL: 39 mg/dL — AB (ref >40)
LDL Cholesterol: 98 mg/dL
Total CHOL/HDL Ratio: 4 Ratio (ref <5.0)
Triglycerides: 99 mg/dL (ref <150)
VLDL: 20 mg/dL (ref <30)

## 2016-09-25 LAB — CBC
HEMATOCRIT: 43.9 % (ref 38.5–50.0)
HEMOGLOBIN: 14.4 g/dL (ref 13.2–17.1)
MCH: 28.5 pg (ref 27.0–33.0)
MCHC: 32.8 g/dL (ref 32.0–36.0)
MCV: 86.8 fL (ref 80.0–100.0)
MPV: 9.3 fL (ref 7.5–12.5)
Platelets: 252 10*3/uL (ref 140–400)
RBC: 5.06 MIL/uL (ref 4.20–5.80)
RDW: 13.4 % (ref 11.0–15.0)
WBC: 5 10*3/uL (ref 3.8–10.8)

## 2016-09-25 NOTE — Progress Notes (Signed)
   Subjective:    Patient ID: Robert Sexton, male    DOB: [birth year], 40 y.o.   MRN: [id number]  HPI Here for CPE.     Review of Systems   BP (!) 107/57   Pulse 77   Wt 199 lb (90.3 kg)   SpO2 100%   BMI 24.87 kg/m     Allergies  Allergen Reactions  . Penicillins Anaphylaxis    Past Medical History:  Diagnosis Date  . PANIC ATTACK 10/05/2010   Qualifier: Diagnosis of  By: Madilyn Fireman MD, Barnetta Chapel      Past Surgical History:  Procedure Laterality Date  . WISDOM TOOTH EXTRACTION      Social History   Social History  . Marital status: Married    Spouse name: N/A  . Number of children: 2  . Years of education: N/A   Occupational History  . Not on file.   Social History Main Topics  . Smoking status: Never Smoker  . Smokeless tobacco: Not on file  . Alcohol use 1.0 oz/week    2 drink(s) per week  . Drug use: No  . Sexual activity: Not on file   Other Topics Concern  . Not on file   Social History Narrative   Play bball sometimes for exercise. Has 2 sons      Family History  Problem Relation Age of Onset  . Atrial fibrillation Mother 60  . Lymphoma Father 8    Outpatient Encounter Prescriptions as of 09/25/2016  Medication Sig  . metoprolol tartrate (LOPRESSOR) 25 MG tablet Take 0.5-1 tablets (12.5-25 mg total) by mouth 2 (two) times daily as needed.  . Multiple Vitamin (MULTIVITAMIN) tablet Take 1 tablet by mouth daily.   No facility-administered encounter medications on file as of 09/25/2016.           Objective:   Physical Exam  Constitutional: He is oriented to person, place, and time. He appears well-developed and well-nourished.  HENT:  Head: Normocephalic and atraumatic.  Right Ear: External ear normal.  Left Ear: External ear normal.  Nose: Nose normal.  Mouth/Throat: Oropharynx is clear and moist.  Eyes: Conjunctivae and EOM are normal. Pupils are equal, round, and reactive to light.  Neck: Normal range of motion. Neck supple. No  thyromegaly present.  Cardiovascular: Normal rate, regular rhythm, normal heart sounds and intact distal pulses.   Pulmonary/Chest: Effort normal and breath sounds normal.  Abdominal: Soft. Bowel sounds are normal. He exhibits no distension and no mass. There is no tenderness. There is no rebound and no guarding.  Musculoskeletal: Normal range of motion.  Lymphadenopathy:    He has no cervical adenopathy.  Neurological: He is alert and oriented to person, place, and time. He has normal reflexes.  Skin: Skin is warm and dry.  Psychiatric: He has a normal mood and affect. His behavior is normal. Judgment and thought content normal.       Assessment & Plan:  Keep up a regular exercise program and make sure you are eating a healthy diet Try to eat 4 servings of dairy a day, or if you are lactose intolerant take a calcium with vitamin D daily.  Your vaccines are up to date.

## 2016-09-25 NOTE — Patient Instructions (Signed)
Keep up a regular exercise program and make sure you are eating a healthy diet Try to eat 4 servings of dairy a day, or if you are lactose intolerant take a calcium with vitamin D daily.  Your vaccines are up to date.   

## 2017-04-11 ENCOUNTER — Encounter: Payer: Self-pay | Admitting: Family Medicine

## 2017-08-21 ENCOUNTER — Ambulatory Visit (INDEPENDENT_AMBULATORY_CARE_PROVIDER_SITE_OTHER): Payer: 59 | Admitting: Family Medicine

## 2017-08-21 ENCOUNTER — Ambulatory Visit (INDEPENDENT_AMBULATORY_CARE_PROVIDER_SITE_OTHER): Payer: 59

## 2017-08-21 ENCOUNTER — Encounter: Payer: Self-pay | Admitting: Family Medicine

## 2017-08-21 VITALS — BP 118/70 | HR 97 | Wt 196.0 lb

## 2017-08-21 DIAGNOSIS — Z23 Encounter for immunization: Secondary | ICD-10-CM | POA: Diagnosis not present

## 2017-08-21 DIAGNOSIS — R079 Chest pain, unspecified: Secondary | ICD-10-CM

## 2017-08-21 DIAGNOSIS — R0789 Other chest pain: Secondary | ICD-10-CM | POA: Diagnosis not present

## 2017-08-21 DIAGNOSIS — R05 Cough: Secondary | ICD-10-CM | POA: Diagnosis not present

## 2017-08-21 NOTE — Patient Instructions (Signed)
Thank you for coming in today. Get labs and a Chest Xray today.  We will contact you with results ASAP.  If the D-dimer is positive we will do a CT angiogram of the chest to look for Pulmonary Embolism.     Costochondritis Costochondritis is swelling and irritation (inflammation) of the tissue (cartilage) that connects your ribs to your breastbone (sternum). This causes pain in the front of your chest. The pain usually starts gradually and involves more than one rib. What are the causes? The exact cause of this condition is not always known. It results from stress on the cartilage where your ribs attach to your sternum. The cause of this stress could be:  Chest injury (trauma).  Exercise or activity, such as lifting.  Severe coughing.  What increases the risk? You may be at higher risk for this condition if you:  Are male.  Are 35?41 years old.  Recently started a new exercise or work activity.  Have low levels of vitamin D.  Have a condition that makes you cough frequently.  What are the signs or symptoms? The main symptom of this condition is chest pain. The pain:  Usually starts gradually and can be sharp or dull.  Gets worse with deep breathing, coughing, or exercise.  Gets better with rest.  May be worse when you press on the sternum-rib connection (tenderness).  How is this diagnosed? This condition is diagnosed based on your symptoms, medical history, and a physical exam. Your health care provider will check for tenderness when pressing on your sternum. This is the most important finding. You may also have tests to rule out other causes of chest pain. These may include:  A chest X-ray to check for lung problems.  An electrocardiogram (ECG) to see if you have a heart problem that could be causing the pain.  An imaging scan to rule out a chest or rib fracture.  How is this treated? This condition usually goes away on its own over time. Your health care  provider may prescribe an NSAID to reduce pain and inflammation. Your health care provider may also suggest that you:  Rest and avoid activities that make pain worse.  Apply heat or cold to the area to reduce pain and inflammation.  Do exercises to stretch your chest muscles.  If these treatments do not help, your health care provider may inject a numbing medicine at the sternum-rib connection to help relieve the pain. Follow these instructions at home:  Avoid activities that make pain worse. This includes any activities that use chest, abdominal, and side muscles.  If directed, put ice on the painful area: ? Put ice in a plastic bag. ? Place a towel between your skin and the bag. ? Leave the ice on for 20 minutes, 2-3 times a day.  If directed, apply heat to the affected area as often as told by your health care provider. Use the heat source that your health care provider recommends, such as a moist heat pack or a heating pad. ? Place a towel between your skin and the heat source. ? Leave the heat on for 20-30 minutes. ? Remove the heat if your skin turns bright red. This is especially important if you are unable to feel pain, heat, or cold. You may have a greater risk of getting burned.  Take over-the-counter and prescription medicines only as told by your health care provider.  Return to your normal activities as told by your health care provider.  Ask your health care provider what activities are safe for you.  Keep all follow-up visits as told by your health care provider. This is important. Contact a health care provider if:  You have chills or a fever.  Your pain does not go away or it gets worse.  You have a cough that does not go away (is persistent). Get help right away if:  You have shortness of breath. This information is not intended to replace advice given to you by your health care provider. Make sure you discuss any questions you have with your health care  provider. Document Released: 08/15/2005 Document Revised: 05/25/2016 Document Reviewed: 02/29/2016 Elsevier Interactive Patient Education  2018 [place], also called pleuritis, is irritation and swelling (inflammation) of the linings of the lungs. The linings of the lungs are called pleura. They cover the outside of the lungs and the inside of the chest wall. There is a small amount of fluid (pleural fluid) between the pleura that allows the lungs to move in and out smoothly when you breathe. Pleurisy causes the pleura to be rough and dry and to rub together when you breathe, which is painful. In some cases, pleurisy can cause pleural fluid to build up between the pleura (pleural effusion). What are the causes? Common causes of this condition include:  A lung infection caused by bacteria or a virus.  A blood clot that travels to the lung (pulmonary embolism).  Air leaking into the pleural space (pneumothorax).  Lung cancer or a lung tumor.  A chest injury.  Diseases that can cause lung inflammation. These include rheumatoid arthritis, lupus, sickle cell disease, inflammatory bowel disease, and pancreatitis.  Heart or chest surgery.  Lung damage from inhaling asbestos.  A lung reaction to certain medicines.  Sometimes the cause is unknown. What are the signs or symptoms? Chest pain is the main symptom of this condition. The pain is usually on one side. Chest pain may start suddenly and be sharp or stabbing. It may become a constant dull ache. You may also feel pain in your back or shoulder. The pain may get worse when you cough, take deep breaths, or make sudden movements. Other symptoms may include:  Shortness of breath.  Noisy breathing (wheezing).  Cough.  Chills.  Fever.  How is this diagnosed? This condition may be diagnosed based on:  Your medical history.  Your symptoms.  A physical exam. Your health care provider will listen to your  breathing with a stethoscope to check for a rough, rubbing sound (friction rub). If you have pleural effusion, your breathing sounds may be muffled.  Tests, such as: ? Blood tests to check for infections or diseases and to measure the oxygen in your blood. ? Imaging studies of your lungs. These may include a chest X-ray, ultrasound, MRI, or CT scan. ? A procedure to remove pleural fluid with a needle for testing (thoracentesis).  How is this treated? Treatment for this condition depends on the cause. Pleurisy that was caused by a virus usually clears up within 2 weeks. Treatment for pleurisy may include:  NSAIDs to help relieve pain and swelling.  Antibiotic medicines, if your condition was caused by a bacterial infection.  Prescription pain or cough medicine.  Medicines to dissolve a blood clot, if your condition was caused by pulmonary embolism.  Removal of pleural fluid or air.  Follow these instructions at home: Medicines  Take over-the-counter and prescription medicines only as told by your  health care provider.  If you were prescribed an antibiotic, take it as told by your health care provider. Do not stop taking the antibiotic even if you start to feel better. Activity  Rest and return to your normal activities as told by your health care provider. Ask your health care provider what activities are safe for you.  Do not drive or use heavy machinery while taking prescription pain medicine. General instructions  Monitor your pleurisy for any changes.  Take deep breaths often, even if it is painful. This can help prevent lung infection (pneumonia) and collapse of lung tissue (atelectasis).  When lying down, lie on your painful side. This may reduce pain.  Do not smoke. If you need help quitting, ask your health care provider.  Keep all follow-up visits as told by your health care provider. This is important. Contact a health care provider if:  You have pain  that: ? Gets worse. ? Does not get better with medicine. ? Lasts for more than 1 week.  You have a fever or chills.  Your cough or shortness of breath is not improving at home.  You cough up pus-like (purulent) secretions. Get help right away if:  Your lips, fingernails, or toenails darken or turn blue.  You cough up blood.  You have any of the following symptoms that get worse: ? Difficulty breathing. ? Shortness of breath. ? Wheezing.  You have pain that spreads into your neck, arms, or jaw.  You develop a rash.  You vomit.  You faint. Summary  Pleurisy is inflammation of the linings of the lungs (pleura).  Pleurisy causes pain that makes it difficult for you to breathe or cough.  Pleurisy is often caused by an underlying infection or disease.  Treatment of pleurisy depends on the cause, and it often includes medicines. This information is not intended to replace advice given to you by your health care provider. Make sure you discuss any questions you have with your health care provider. Document Released: 11/05/2005 Document Revised: 07/30/2016 Document Reviewed: 07/30/2016 Elsevier Interactive Patient Education  2017 [employer].

## 2017-08-21 NOTE — Progress Notes (Signed)
Robert Sexton is a 40 y.o. male who presents to [facility] today for chest pain. Patient has a three-day history of chest pain across the chest worse on the left. He denies any exertional component. He notes is slightly worse when he takes a deep breath. He does note he's had some recent prolonged travel and immobilization and is worried about pulmonary embolism. He denies any leg swelling. He has a history of palpitations. He uses metoprolol intermittently. He denies any worsening palpitations. No fevers chills nausea vomiting diarrhea or trouble breathing.   Past Medical History:  Diagnosis Date  . PANIC ATTACK 10/05/2010   Qualifier: Diagnosis of  By: Madilyn Fireman MD, Barnetta Chapel     Past Surgical History:  Procedure Laterality Date  . WISDOM TOOTH EXTRACTION     Social History  Substance Use Topics  . Smoking status: Never Smoker  . Smokeless tobacco: Never Used  . Alcohol use 1.0 oz/week    2 Standard drinks or equivalent per week   family history includes Atrial fibrillation (age of onset: 51) in his mother; Lymphoma (age of onset: 69) in his father.  ROS as above:  Medications: Current Outpatient Prescriptions  Medication Sig Dispense Refill  . metoprolol tartrate (LOPRESSOR) 25 MG tablet Take 0.5-1 tablets (12.5-25 mg total) by mouth 2 (two) times daily as needed. 30 tablet 1  . Multiple Vitamin (MULTIVITAMIN) tablet Take 1 tablet by mouth daily.     No current facility-administered medications for this visit.    Allergies  Allergen Reactions  . Penicillins Anaphylaxis    Health Maintenance Health Maintenance  Topic Date Due  . TETANUS/TDAP  04/20/2017  . INFLUENZA VACCINE  06/19/2017  . HIV Screening  Completed     Exam:  BP 118/70   Pulse 97   Wt 196 lb (88.9 kg)   BMI 24.50 kg/m  Gen: Well NAD HEENT: EOMI,  MMM Lungs: Normal work of  breathing. CTABL Heart: RRR no MRG Abd: NABS, Soft. Nondistended, Nontender Exts: Brisk capillary refill, warm and well perfused. No calf swelling or palpable cords  12-lead EKG: Normal sinus rhythm at 80 bpm. No ST segment elevation or depression. Normal T waves. No Q waves. Normal EKG.   No results found for this or any previous visit (from the past 72 hour(s)). Dg Chest 2 View  Result Date: 08/21/2017 CLINICAL DATA:  Cough and chest pain for several days EXAM: CHEST  2 VIEW COMPARISON:  None. FINDINGS: The heart size and mediastinal contours are within normal limits. Both lungs are clear. The visualized skeletal structures are unremarkable. IMPRESSION: No active cardiopulmonary disease. Electronically Signed   By: Inez Catalina M.D.   On: 08/21/2017 16:59      Assessment and Plan: 41 y.o. male with chest pain likely costochondritis. Chest x-ray and EKG are unremarkable. Patient does have a history of recent immobility so we'll obtain a d-dimer and basic metabolic panel and CBC. However I anticipate this to be negative. The likely diagnosis is costochondritis. Plan to reassure patient if labs are normal. If d-dimer is elevated will proceed with a CT angiogram.  Tdap and influenza vaccine are given today.   Orders Placed This Encounter  Procedures  . DG Chest 2 View    Order Specific Question:   Reason for exam:    Answer:   Cough, assess intra-thoracic pathology    Order Specific Question:   Preferred imaging location?    Answer:  [facility]  . Tdap vaccine greater than or equal to 7yo IM  . Flu Vaccine QUAD 36+ mos IM  . D-dimer, quantitative (not at [facility])  . CBC  . BASIC METABOLIC PANEL WITH GFR  . EKG 12-Lead   No orders of the defined types were placed in this encounter.    Discussed warning signs or symptoms. Please see discharge instructions. Patient expresses understanding.  I spent 40 minutes with this patient, greater than 50% was face-to-face time  counseling regarding ddx and treatment plan. Marland Kitchen

## 2017-08-22 LAB — CBC
HEMATOCRIT: 40.9 % (ref 38.5–50.0)
Hemoglobin: 13.8 g/dL (ref 13.2–17.1)
MCH: 28.5 pg (ref 27.0–33.0)
MCHC: 33.7 g/dL (ref 32.0–36.0)
MCV: 84.3 fL (ref 80.0–100.0)
MPV: 9.8 fL (ref 7.5–12.5)
PLATELETS: 275 10*3/uL (ref 140–400)
RBC: 4.85 10*6/uL (ref 4.20–5.80)
RDW: 12.4 % (ref 11.0–15.0)
WBC: 5.5 10*3/uL (ref 3.8–10.8)

## 2017-08-22 LAB — BASIC METABOLIC PANEL WITH GFR
BUN: 11 mg/dL (ref 7–25)
CALCIUM: 9.9 mg/dL (ref 8.6–10.3)
CO2: 33 mmol/L — ABNORMAL HIGH (ref 20–32)
CREATININE: 0.91 mg/dL (ref 0.60–1.35)
Chloride: 102 mmol/L (ref 98–110)
GFR, EST AFRICAN AMERICAN: 121 mL/min/{1.73_m2} (ref >=60)
GFR, EST NON AFRICAN AMERICAN: 104 mL/min/{1.73_m2} (ref >=60)
Glucose, Bld: 79 mg/dL (ref 65–99)
Potassium: 4.2 mmol/L (ref 3.5–5.3)
Sodium: 140 mmol/L (ref 135–146)

## 2017-08-22 LAB — D-DIMER, QUANTITATIVE: D-Dimer, Quant: 0.2 mcg/mL FEU (ref <0.50)

## 2017-10-20 ENCOUNTER — Other Ambulatory Visit: Payer: Self-pay | Admitting: Family Medicine

## 2017-11-30 ENCOUNTER — Other Ambulatory Visit: Payer: Self-pay | Admitting: Family Medicine

## 2017-12-03 ENCOUNTER — Telehealth: Payer: Self-pay | Admitting: Family Medicine

## 2017-12-03 NOTE — Telephone Encounter (Signed)
Left pt a message on VM to call and schedule his CPE with [facility]

## 2018-01-30 ENCOUNTER — Telehealth: Payer: Self-pay | Admitting: Family Medicine

## 2018-01-30 NOTE — Telephone Encounter (Signed)
Called pt and left a message for him to schedule a f/u with Dr. Madilyn Fireman on his BP

## 2018-02-17 ENCOUNTER — Encounter: Payer: 59 | Admitting: Family Medicine

## 2018-02-17 DIAGNOSIS — Z0189 Encounter for other specified special examinations: Secondary | ICD-10-CM

## 2018-02-25 ENCOUNTER — Encounter: Payer: Self-pay | Admitting: Family Medicine

## 2018-02-25 ENCOUNTER — Ambulatory Visit (INDEPENDENT_AMBULATORY_CARE_PROVIDER_SITE_OTHER): Payer: 59 | Admitting: Family Medicine

## 2018-02-25 VITALS — BP 111/67 | HR 79 | Ht 75.0 in | Wt 194.0 lb

## 2018-02-25 DIAGNOSIS — Z0000 Encounter for general adult medical examination without abnormal findings: Secondary | ICD-10-CM

## 2018-02-25 NOTE — Patient Instructions (Signed)

## 2018-02-25 NOTE — Progress Notes (Addendum)
Subjective:    Patient ID: Robert Sexton, male    DOB: [birth year], 42 y.o.   MRN: [id number]  HPI 59 male is here for CPE today.  He has been having some pain in his left shoulder for a few months. ONly painful when fully extends his shoulder to 180 degrees. No loss of ROM.  Has been athletic most of his life. Plays tennis and basketball.  No recent trauma. Not painful to sleep on that shoulder. Worried about his rotator cuff.    Uses his metoprolol maybe once a month for anxiety.    Review of Systems  Comprehensive ROS is negative except for HPI.  No CP or SOB.    BP 111/67   Pulse 79   Ht 6\' 3"  (1.905 m)   Wt 194 lb (88 kg)   SpO2 100%   BMI 24.25 kg/m     Allergies  Allergen Reactions  . Penicillins Anaphylaxis    Past Medical History:  Diagnosis Date  . PANIC ATTACK 10/05/2010   Qualifier: Diagnosis of  By: Madilyn Fireman MD, Barnetta Chapel      Past Surgical History:  Procedure Laterality Date  . WISDOM TOOTH EXTRACTION      Social History   Socioeconomic History  . Marital status: Married    Spouse name: Not on file  . Number of children: 2  . Years of education: Not on file  . Highest education level: Not on file  Occupational History  . Not on file  Social Needs  . Financial resource strain: Not on file  . Food insecurity:    Worry: Not on file    Inability: Not on file  . Transportation needs:    Medical: Not on file    Non-medical: Not on file  Tobacco Use  . Smoking status: Never Smoker  . Smokeless tobacco: Never Used  Substance and Sexual Activity  . Alcohol use: Yes    Alcohol/week: 1.0 oz    Types: 2 Standard drinks or equivalent per week  . Drug use: No  . Sexual activity: Not on file  Lifestyle  . Physical activity:    Days per week: Not on file    Minutes per session: Not on file  . Stress: Not on file  Relationships  . Social connections:    Talks on phone: Not on file    Gets together: Not on file    Attends religious service: Not on  file    Active member of club or organization: Not on file    Attends meetings of clubs or organizations: Not on file    Relationship status: Not on file  . Intimate partner violence:    Fear of current or ex partner: Not on file    Emotionally abused: Not on file    Physically abused: Not on file    Forced sexual activity: Not on file  Other Topics Concern  . Not on file  Social History Narrative   Play bball sometimes for exercise. Has 2 sons      Family History  Problem Relation Age of Onset  . Atrial fibrillation Mother 33  . Lymphoma Father 25    Outpatient Encounter Medications as of 02/25/2018  Medication Sig  . metoprolol tartrate (LOPRESSOR) 25 MG tablet Take 1 tablet (25 mg total) by mouth 2 (two) times daily. LAST REFILL.[place]. PLEASE CALL AND SCHEDULE AN APPOINTMENT.  . Multiple Vitamin (MULTIVITAMIN) tablet Take 1 tablet by mouth daily.   No  facility-administered encounter medications on file as of 02/25/2018.           Objective:   Physical Exam  Constitutional: He is oriented to person, place, and time. He appears well-developed and well-nourished.  HENT:  Head: Normocephalic and atraumatic.  Right Ear: External ear normal.  Left Ear: External ear normal.  Nose: Nose normal.  Mouth/Throat: Oropharynx is clear and moist.  Eyes: Pupils are equal, round, and reactive to light. Conjunctivae and EOM are normal.  Neck: Normal range of motion. Neck supple. No thyromegaly present.  Cardiovascular: Normal rate, regular rhythm, normal heart sounds and intact distal pulses.  Pulmonary/Chest: Effort normal and breath sounds normal.  Abdominal: Soft. Bowel sounds are normal. He exhibits no distension and no mass. There is no tenderness. There is no rebound and no guarding.  Musculoskeletal: Normal range of motion.  Lymphadenopathy:    He has no cervical adenopathy.  Neurological: He is alert and oriented to person, place, and time. He has normal reflexes.   Skin: Skin is warm and dry.  Psychiatric: He has a normal mood and affect. His behavior is normal. Judgment and thought content normal.        Assessment & Plan:  CPE Keep up a regular exercise program and make sure you are eating a healthy diet Try to eat 4 servings of dairy a day, or if you are lactose intolerant take a calcium with vitamin D daily.  Your vaccines are up to date.  Labs due.   Encouraged more regular exercise.    Left shoulder pain - encouraged him to consult with one of our sports med docs.

## 2018-02-26 LAB — LIPID PANEL
Cholesterol: 162 mg/dL (ref <200)
HDL: 42 mg/dL (ref >40)
LDL CHOLESTEROL (CALC): 104 mg/dL — AB
NON-HDL CHOLESTEROL (CALC): 120 mg/dL (ref <130)
TRIGLYCERIDES: 75 mg/dL (ref <150)
Total CHOL/HDL Ratio: 3.9 (calc) (ref <5.0)

## 2018-02-26 LAB — COMPLETE METABOLIC PANEL WITH GFR
AG RATIO: 1.7 (calc) (ref 1.0–2.5)
ALT: 16 U/L (ref 9–46)
AST: 13 U/L (ref 10–40)
Albumin: 4.5 g/dL (ref 3.6–5.1)
Alkaline phosphatase (APISO): 44 U/L (ref 40–115)
BILIRUBIN TOTAL: 0.8 mg/dL (ref 0.2–1.2)
BUN: 11 mg/dL (ref 7–25)
CALCIUM: 9.7 mg/dL (ref 8.6–10.3)
CHLORIDE: 103 mmol/L (ref 98–110)
CO2: 30 mmol/L (ref 20–32)
Creat: 1.03 mg/dL (ref 0.60–1.35)
GFR, EST AFRICAN AMERICAN: 104 mL/min/{1.73_m2} (ref >=60)
GFR, EST NON AFRICAN AMERICAN: 90 mL/min/{1.73_m2} (ref >=60)
Globulin: 2.6 g/dL (calc) (ref 1.9–3.7)
Glucose, Bld: 85 mg/dL (ref 65–99)
POTASSIUM: 4.3 mmol/L (ref 3.5–5.3)
Sodium: 140 mmol/L (ref 135–146)
TOTAL PROTEIN: 7.1 g/dL (ref 6.1–8.1)

## 2018-02-26 LAB — CBC
HEMATOCRIT: 40.5 % (ref 38.5–50.0)
Hemoglobin: 13.9 g/dL (ref 13.2–17.1)
MCH: 28.8 pg (ref 27.0–33.0)
MCHC: 34.3 g/dL (ref 32.0–36.0)
MCV: 83.9 fL (ref 80.0–100.0)
MPV: 9.8 fL (ref 7.5–12.5)
Platelets: 251 10*3/uL (ref 140–400)
RBC: 4.83 10*6/uL (ref 4.20–5.80)
RDW: 12.4 % (ref 11.0–15.0)
WBC: 6.3 10*3/uL (ref 3.8–10.8)

## 2018-05-07 ENCOUNTER — Telehealth: Payer: Self-pay | Admitting: Family Medicine

## 2018-05-07 NOTE — Telephone Encounter (Signed)
Pt was charged a no show for apt on 02/17/18. States he came into clinic and was advised he would not be charged since he came in and had appt moved. Routing for review.

## 2018-05-21 ENCOUNTER — Other Ambulatory Visit: Payer: Self-pay

## 2018-05-21 ENCOUNTER — Emergency Department (INDEPENDENT_AMBULATORY_CARE_PROVIDER_SITE_OTHER)
Admission: EM | Admit: 2018-05-21 | Discharge: 2018-05-21 | Disposition: A | Discharge status: Home / Self Care | Payer: 59 | Source: Home / Self Care | Attending: Family Medicine | Admitting: Family Medicine

## 2018-05-21 ENCOUNTER — Emergency Department (INDEPENDENT_AMBULATORY_CARE_PROVIDER_SITE_OTHER): Payer: 59

## 2018-05-21 DIAGNOSIS — R1033 Periumbilical pain: Secondary | ICD-10-CM

## 2018-05-21 DIAGNOSIS — R1031 Right lower quadrant pain: Secondary | ICD-10-CM | POA: Diagnosis not present

## 2018-05-21 LAB — POCT URINALYSIS DIP (MANUAL ENTRY)
Bilirubin, UA: NEGATIVE
Blood, UA: NEGATIVE
Glucose, UA: NEGATIVE mg/dL
Ketones, POC UA: NEGATIVE mg/dL
Leukocytes, UA: NEGATIVE
Nitrite, UA: NEGATIVE
Protein Ur, POC: NEGATIVE mg/dL
Spec Grav, UA: 1.02 (ref 1.010–1.025)
Urobilinogen, UA: 0.2 E.U./dL
pH, UA: 5.5 (ref 5.0–8.0)

## 2018-05-21 LAB — POCT CBC W AUTO DIFF (K'VILLE URGENT CARE)

## 2018-05-21 LAB — COMPLETE METABOLIC PANEL WITH GFR
AG Ratio: 1.7 (calc) (ref 1.0–2.5)
ALT: 14 U/L (ref 9–46)
AST: 14 U/L (ref 10–40)
Albumin: 4.5 g/dL (ref 3.6–5.1)
Alkaline phosphatase (APISO): 48 U/L (ref 40–115)
BUN: 14 mg/dL (ref 7–25)
CO2: 27 mmol/L (ref 20–32)
Calcium: 9.6 mg/dL (ref 8.6–10.3)
Chloride: 103 mmol/L (ref 98–110)
Creat: 1 mg/dL (ref 0.60–1.35)
GFR, Est African American: 107 mL/min/{1.73_m2} (ref >=60)
GFR, Est Non African American: 92 mL/min/{1.73_m2} (ref >=60)
Globulin: 2.7 g/dL (calc) (ref 1.9–3.7)
Glucose, Bld: 92 mg/dL (ref 65–99)
Potassium: 4.4 mmol/L (ref 3.5–5.3)
Sodium: 138 mmol/L (ref 135–146)
Total Bilirubin: 0.9 mg/dL (ref 0.2–1.2)
Total Protein: 7.2 g/dL (ref 6.1–8.1)

## 2018-05-21 MED ORDER — IOPAMIDOL (ISOVUE-300) INJECTION 61%
100.0000 mL | Freq: Once | INTRAVENOUS | Status: AC | PRN
  Administered 2018-05-21: 100 mL via INTRAVENOUS

## 2018-05-21 NOTE — Discharge Instructions (Signed)
°  There was no signs of appendicitis, diverticulitis, or other concerning cause of your symptoms on CT scan.  It is recommended you stay well hydrated, try not to strain while having a bowel movement, you may take an over the counter stool softener.  Avoid heavy lifting in case pain is due to a muscle strain. You may take Tylenol or Motrin for the pain and apply a heating pad or warm damp washcloth for pain relief.  Please follow up with Dr. Madilyn Fireman next week if not improving. Please call 911 or go to the hospital if symptoms worsening- worsening pain, unable to keep down fluids, or other new concerning symptoms develop.

## 2018-05-21 NOTE — ED Provider Notes (Signed)
[facility]    CSN: [id number] Arrival date & time: 05/21/18  0813     History   Chief Complaint Chief Complaint  Patient presents with  . Abdominal Pain    HPI Demone Lyles is a 42 y.o. male.   HPI  Saahas Hidrogo is a 42 y.o. male presenting to UC with c/o intermittent abdominal pain described as a dull ache for about 2 weeks.  Last night he had severe RLQ abdominal pain that was aching and sore. He called the oncall nurse with his PCP, who recommended he go to the emergency department to r/o appendicitis.  Pain eventually died down so he did not go to the ED last night.  Pain is still present today, 3/10, dull ache around his belly button and RLQ.  Wife encouraged him to be evaluated today to make sure he does not have appendicitis. Denies fever, chills, n/v/d today but notes he did have nausea last night and felt constipated but does not feel constipated today. No hx of hernias. Denies urinary symptoms or renal stones.    Past Medical History:  Diagnosis Date  . PANIC ATTACK 10/05/2010   Qualifier: Diagnosis of  By: Madilyn Fireman MD, Barnetta Chapel      Patient Active Problem List   Diagnosis Date Noted  . Palpitations 07/20/2014  . Dysplastic nevus 06/25/2013  . PANIC ATTACK 10/05/2010    Past Surgical History:  Procedure Laterality Date  . WISDOM TOOTH EXTRACTION         Home Medications    Prior to Admission medications   Medication Sig Start Date End Date Taking? Authorizing Provider  metoprolol tartrate (LOPRESSOR) 25 MG tablet Take 1 tablet (25 mg total) by mouth 2 (two) times daily. LAST REFILL.[place]. PLEASE CALL AND SCHEDULE AN APPOINTMENT. 12/02/17   Hali Marry, MD  Multiple Vitamin (MULTIVITAMIN) tablet Take 1 tablet by mouth daily.    [provider]    Family History Family History  Problem Relation Age of Onset  . Atrial fibrillation Mother 66  . Lymphoma Father 73    Social History Social History   Tobacco  Use  . Smoking status: Never Smoker  . Smokeless tobacco: Never Used  Substance Use Topics  . Alcohol use: Yes    Alcohol/week: 1.2 oz    Types: 2 Standard drinks or equivalent per week  . Drug use: No     Allergies   Penicillins   Review of Systems Review of Systems  Constitutional: Negative for chills and fever.  Gastrointestinal: Positive for abdominal pain and nausea. Negative for diarrhea and rectal pain.  Genitourinary: Negative for dysuria, flank pain, frequency and hematuria.     Physical Exam Triage Vital Signs ED Triage Vitals  Enc Vitals Group     BP 05/21/18 0832 122/78     Pulse Rate 05/21/18 0832 75     Resp --      Temp 05/21/18 0832 98.4 F (36.9 C)     Temp Source 05/21/18 0832 Oral     SpO2 05/21/18 0832 100 %     Weight 05/21/18 0833 193 lb (87.5 kg)     Height 05/21/18 0833 6\' 5"  (1.956 m)     Head Circumference --      Peak Flow --      Pain Score 05/21/18 0833 2     Pain Loc --      Pain Edu? --      Excl. in [place]? --  No data found.  Updated Vital Signs BP 122/78 (BP Location: Right Arm)   Pulse 75   Temp 98.4 F (36.9 C) (Oral)   Ht 6\' 5"  (1.956 m)   Wt 193 lb (87.5 kg)   SpO2 100%   BMI 22.89 kg/m   Visual Acuity Right Eye Distance:   Left Eye Distance:   Bilateral Distance:    Right Eye Near:   Left Eye Near:    Bilateral Near:     Physical Exam  Constitutional: He is oriented to person, place, and time. He appears well-developed and well-nourished.  Non-toxic appearance. He does not appear ill. No distress.  HENT:  Head: Normocephalic and atraumatic.  Mouth/Throat: Oropharynx is clear and moist.  Eyes: EOM are normal.  Neck: Normal range of motion.  Cardiovascular: Normal rate and regular rhythm.  Pulmonary/Chest: Effort normal and breath sounds normal.  Abdominal: Soft. Normal appearance and bowel sounds are normal. There is tenderness in the right lower quadrant and periumbilical area. There is tenderness at  McBurney's point. There is no rigidity and no CVA tenderness.  Musculoskeletal: Normal range of motion.  Neurological: He is alert and oriented to person, place, and time.  Skin: Skin is warm and dry.  Psychiatric: He has a normal mood and affect. His behavior is normal.  Nursing note and vitals reviewed.    UC Treatments / Results  Labs (all labs ordered are listed, but only abnormal results are displayed) Labs Reviewed  COMPLETE METABOLIC PANEL WITH GFR  POCT CBC W AUTO DIFF ([facility])  POCT URINALYSIS DIP (MANUAL ENTRY)    EKG None  Radiology Ct Abdomen Pelvis W Contrast  Result Date: 05/21/2018 CLINICAL DATA:  Right lower quadrant pain for several days EXAM: CT ABDOMEN AND PELVIS WITH CONTRAST TECHNIQUE: Multidetector CT imaging of the abdomen and pelvis was performed using the standard protocol following bolus administration of intravenous contrast. CONTRAST:  163mL ISOVUE-300 IOPAMIDOL (ISOVUE-300) INJECTION 61% COMPARISON:  None. FINDINGS: Lower chest: No acute abnormality. Hepatobiliary: No focal liver abnormality is seen. No gallstones, gallbladder wall thickening, or biliary dilatation. Pancreas: Unremarkable. No pancreatic ductal dilatation or surrounding inflammatory changes. Spleen: Normal in size without focal abnormality. Adrenals/Urinary Tract: Adrenal glands are unremarkable. Kidneys are normal, without renal calculi, focal lesion, or hydronephrosis. Bladder is unremarkable. Stomach/Bowel: Appendix is well visualized and retrocecal in position. No significant inflammatory changes are identified no evidence of perforation is identified. Small bowel demonstrates no obstructive or inflammatory changes. No specific right lower quadrant inflammatory changes are noted. Vascular/Lymphatic: No significant vascular findings are present. No enlarged abdominal or pelvic lymph nodes. Retroaortic left renal vein is noted. Reproductive: Prostate is unremarkable. Other: No  abdominal wall hernia or abnormality. No abdominopelvic ascites. Musculoskeletal: No acute or significant osseous findings. IMPRESSION: No evidence of appendicitis. No specific abnormality to correspond with the patient's clinical symptomatology is noted. Electronically Signed   By: Inez Catalina M.D.   On: 05/21/2018 09:43    Procedures Procedures (including critical care time)  Medications Ordered in UC Medications - No data to display  Initial Impression / Assessment and Plan / UC Course  I have reviewed the triage vital signs and the nursing notes.  Pertinent labs & imaging results that were available during my care of the patient were reviewed by me and considered in my medical decision making (see chart for details).     Pt is afebrile and appears well overall, however, pt is tender at [employer]. CT scan ordered,  results discussed with pt Encouraged symptomatic treatment. Home care instructions provided below.    Final Clinical Impressions(s) / UC Diagnoses   Final diagnoses:  RLQ abdominal pain  Acute periumbilical pain     Discharge Instructions      There was no signs of appendicitis, diverticulitis, or other concerning cause of your symptoms on CT scan.  It is recommended you stay well hydrated, try not to strain while having a bowel movement, you may take an over the counter stool softener.  Avoid heavy lifting in case pain is due to a muscle strain. You may take Tylenol or Motrin for the pain and apply a heating pad or warm damp washcloth for pain relief.  Please follow up with Dr. Madilyn Fireman next week if not improving. Please call 911 or go to the hospital if symptoms worsening- worsening pain, unable to keep down fluids, or other new concerning symptoms develop.    ED Prescriptions    None     Controlled Substance Prescriptions [place] Controlled Substance Registry consulted? Not Applicable   Tyrell Antonio 05/21/18 1034

## 2018-05-21 NOTE — ED Triage Notes (Addendum)
Pt has had abdominal pain, a dull ache off and on for about 2 weeks.  Last night had more severe pain which resolved, and now has dull ache in lower right quad.

## 2018-05-22 ENCOUNTER — Telehealth: Payer: Self-pay | Admitting: *Deleted

## 2018-05-22 NOTE — Telephone Encounter (Signed)
LM with CMP results. Call back if he has any questions or concerns.

## 2018-07-27 ENCOUNTER — Encounter: Payer: Self-pay | Admitting: Family Medicine

## 2019-01-05 IMAGING — CT CT ABD-PELV W/ CM
2 of 5 series · 16 of 46 positions shown, 18 images · IV contrast (APPLIED)
Comparison: None.

CLINICAL DATA: Right lower quadrant pain for several days

EXAM:
CT ABDOMEN AND PELVIS WITH CONTRAST
TECHNIQUE: Multidetector CT imaging of the abdomen and pelvis was performed
using the standard protocol following bolus administration of
intravenous contrast.
CONTRAST:  100mL BQH6CF-666 IOPAMIDOL (BQH6CF-666) INJECTION 61%

[Series 2: axial st · axial · 0.78mm/px · z∈[-718,-228]mm · 13 of 110 slices shown, 15 images]
[im 6/110  soft-tissue]
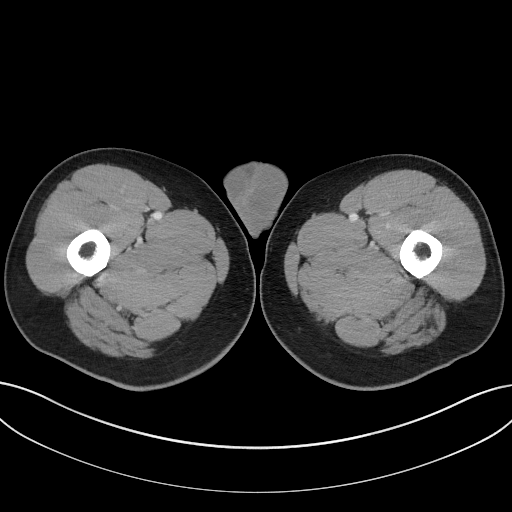
[im 6/110  bone]
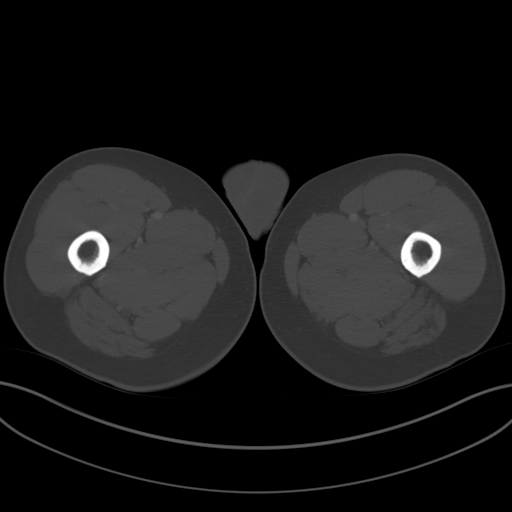
[im 17/110  soft-tissue]
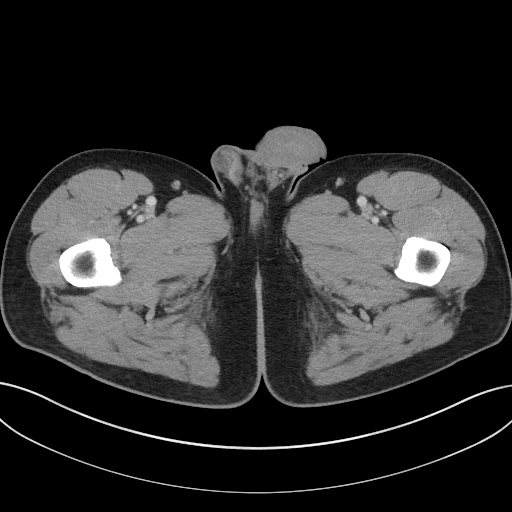
[im 22/110  soft-tissue]
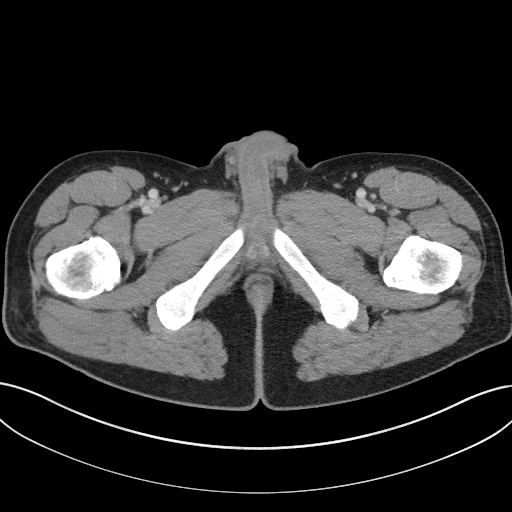
[im 33/110  soft-tissue]
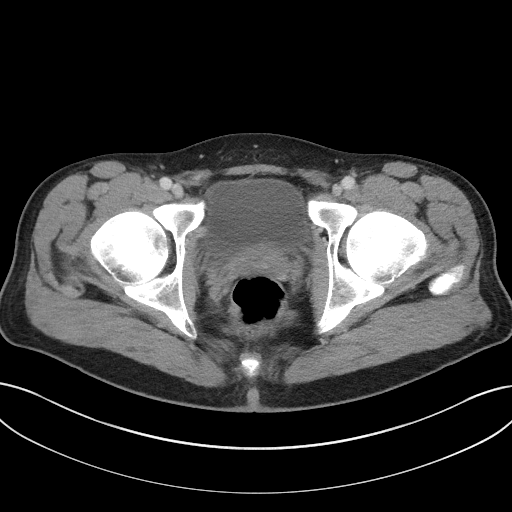
[im 39/110  soft-tissue]
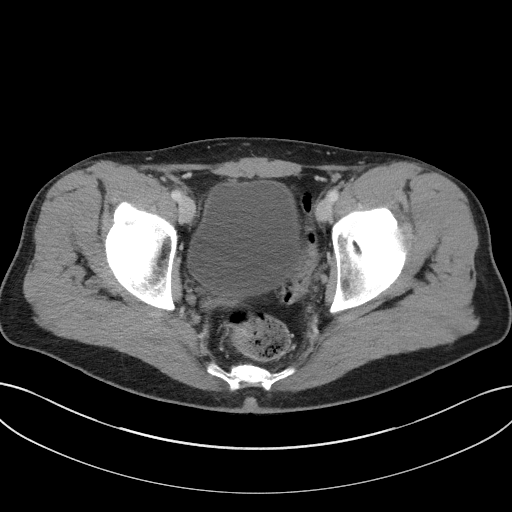
[im 50/110  soft-tissue]
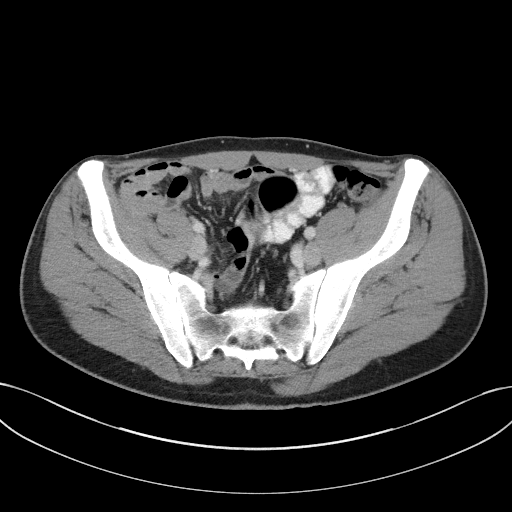
[im 55/110  soft-tissue]
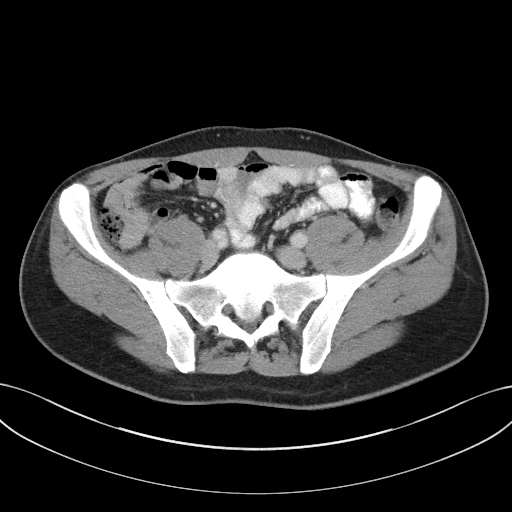
[im 60/110  soft-tissue]
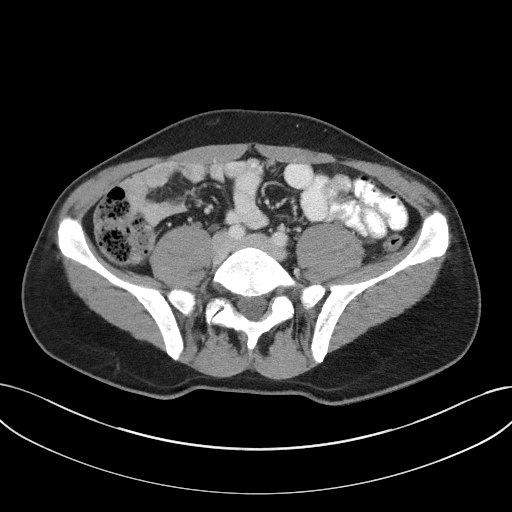
[im 71/110  soft-tissue]
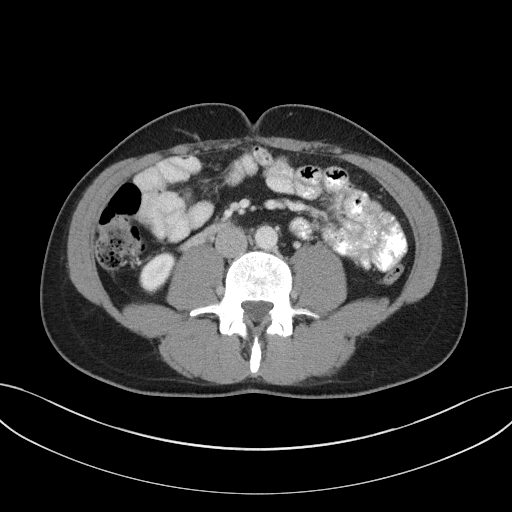
[im 71/110  bone]
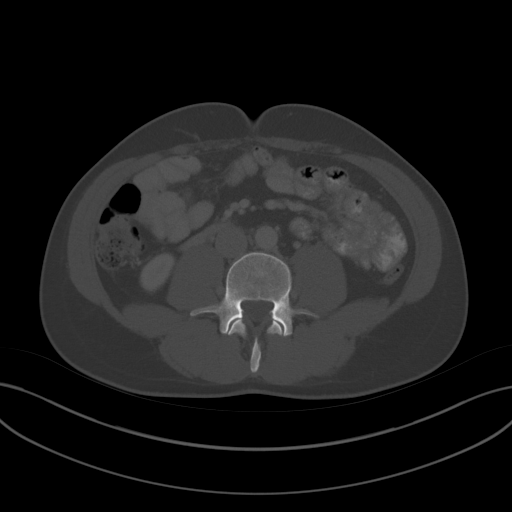
[im 77/110  soft-tissue]
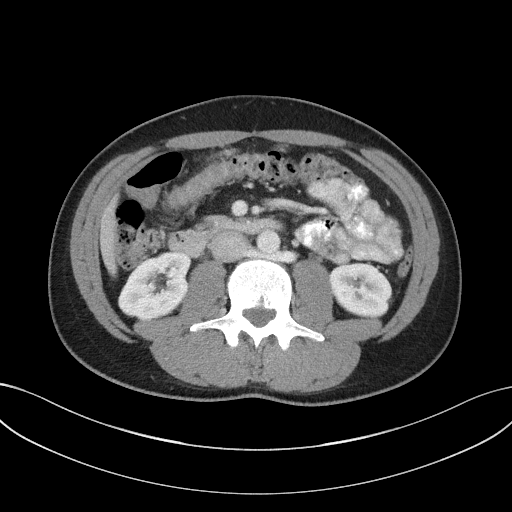
[im 88/110  soft-tissue]
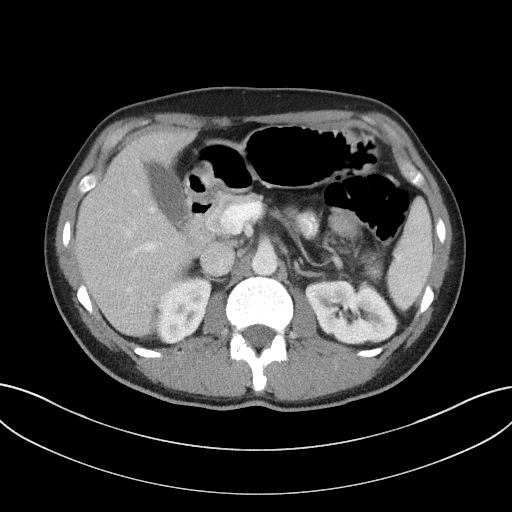
[im 93/110  soft-tissue]
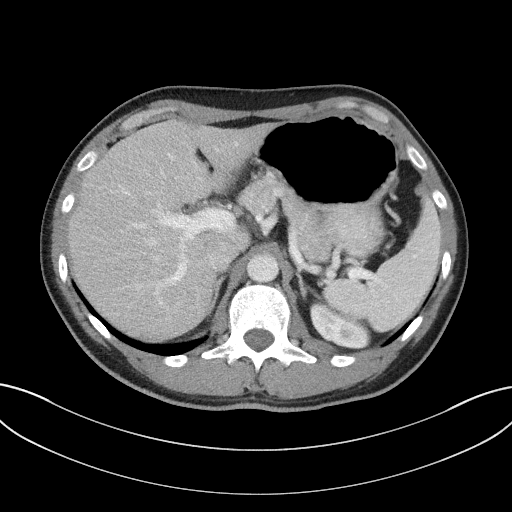
[im 104/110  soft-tissue]
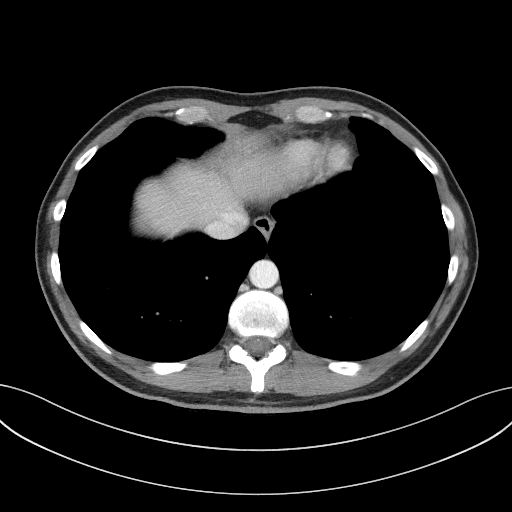

[Series 4: coronal st · coronal · 0.85mm/px · 3 of 76 slices shown]
[im 26/76  soft-tissue]
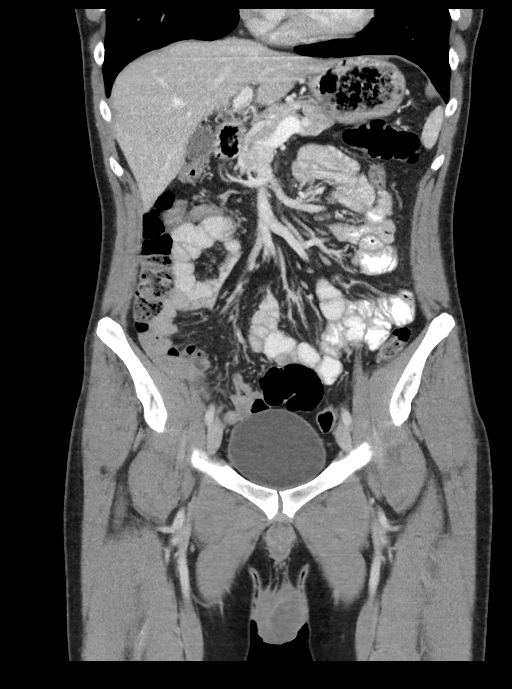
[im 34/76  soft-tissue]
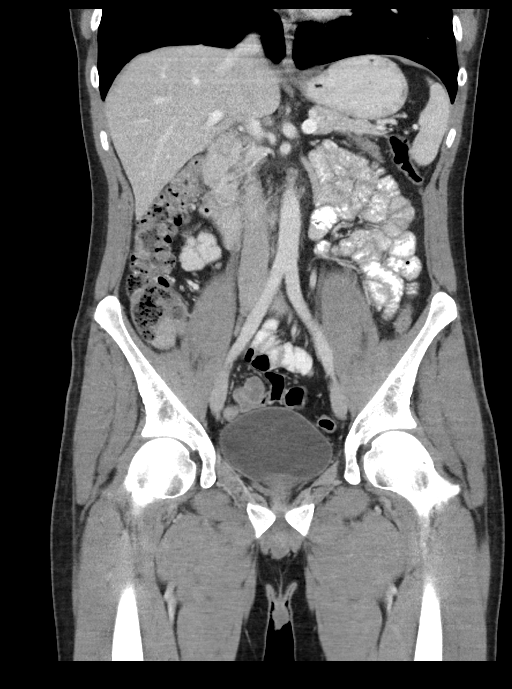
[im 42/76  soft-tissue]
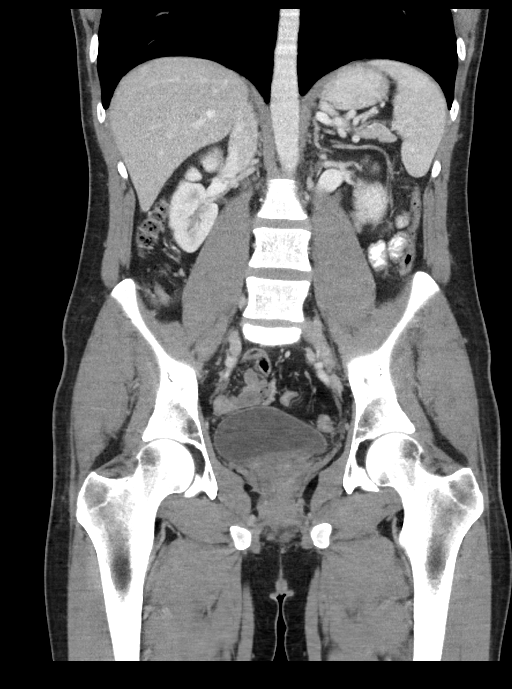

[16 of 46 positions shown; findings below may reference images not displayed]

FINDINGS: Lower chest: No acute abnormality.

Hepatobiliary: No focal liver abnormality is seen. No gallstones,
gallbladder wall thickening, or biliary dilatation.

Pancreas: Unremarkable. No pancreatic ductal dilatation or
surrounding inflammatory changes.

Spleen: Normal in size without focal abnormality.

Adrenals/Urinary Tract: Adrenal glands are unremarkable. Kidneys are
normal, without renal calculi, focal lesion, or hydronephrosis.
Bladder is unremarkable.

Stomach/Bowel: Appendix is well visualized and retrocecal in
position. No significant inflammatory changes are identified no
evidence of perforation is identified. Small bowel demonstrates no
obstructive or inflammatory changes. No specific right lower
quadrant inflammatory changes are noted..

Vascular/Lymphatic: No significant vascular findings are present. No
enlarged abdominal or pelvic lymph nodes. Retroaortic left renal
vein is noted.

Reproductive: Prostate is unremarkable.

Other: No abdominal wall hernia or abnormality. No abdominopelvic
ascites.

Musculoskeletal: No acute or significant osseous findings.
IMPRESSION: No evidence of appendicitis. No specific abnormality to correspond
with the patient's clinical symptomatology is noted.

## 2019-02-26 ENCOUNTER — Encounter: Payer: 59 | Admitting: Family Medicine

## 2019-03-30 ENCOUNTER — Encounter: Payer: 59 | Admitting: Family Medicine

## 2019-05-04 ENCOUNTER — Encounter: Payer: Self-pay | Admitting: Family Medicine

## 2019-05-04 ENCOUNTER — Ambulatory Visit (INDEPENDENT_AMBULATORY_CARE_PROVIDER_SITE_OTHER): Payer: 59 | Admitting: Family Medicine

## 2019-05-04 ENCOUNTER — Telehealth: Payer: Self-pay | Admitting: *Deleted

## 2019-05-04 VITALS — BP 111/69 | HR 96 | Ht 75.0 in | Wt 198.0 lb

## 2019-05-04 DIAGNOSIS — R1031 Right lower quadrant pain: Secondary | ICD-10-CM

## 2019-05-04 DIAGNOSIS — Z0000 Encounter for general adult medical examination without abnormal findings: Secondary | ICD-10-CM

## 2019-05-04 DIAGNOSIS — G8929 Other chronic pain: Secondary | ICD-10-CM | POA: Diagnosis not present

## 2019-05-04 LAB — LIPID PANEL
Cholesterol: 180 (ref 0–200)
LDL Cholesterol: 120

## 2019-05-04 LAB — BASIC METABOLIC PANEL: Glucose: 89

## 2019-05-04 MED ORDER — METOPROLOL TARTRATE 25 MG PO TABS: 25.0000 mg | ORAL_TABLET | Freq: Two times a day (BID) | ORAL | 0 refills | Status: DC | PRN

## 2019-05-04 NOTE — Patient Instructions (Signed)
Preventive Care 40-64 Years, Male Preventive care refers to lifestyle choices and visits with your health care provider that can promote health and wellness. What does preventive care include?   A yearly physical exam. This is also called an annual well check.  Dental exams once or twice a year.  Routine eye exams. Ask your health care provider how often you should have your eyes checked.  Personal lifestyle choices, including: ? Daily care of your teeth and gums. ? Regular physical activity. ? Eating a healthy diet. ? Avoiding tobacco and drug use. ? Limiting alcohol use. ? Practicing safe sex. ? Taking low-dose aspirin every day starting at age 50. What happens during an annual well check? The services and screenings done by your health care provider during your annual well check will depend on your age, overall health, lifestyle risk factors, and family history of disease. Counseling Your health care provider may ask you questions about your:  Alcohol use.  Tobacco use.  Drug use.  Emotional well-being.  Home and relationship well-being.  Sexual activity.  Eating habits.  Work and work environment. Screening You may have the following tests or measurements:  Height, weight, and BMI.  Blood pressure.  Lipid and cholesterol levels. These may be checked every 5 years, or more frequently if you are over 50 years old.  Skin check.  Lung cancer screening. You may have this screening every year starting at age 55 if you have a 30-pack-year history of smoking and currently smoke or have quit within the past 15 years.  Colorectal cancer screening. All adults should have this screening starting at age 50 and continuing until age 75. Your health care provider may recommend screening at age 45. You will have tests every 1-10 years, depending on your results and the type of screening test. People at increased risk should start screening at an earlier age. Screening tests may  include: ? Guaiac-based fecal occult blood testing. ? Fecal immunochemical test (FIT). ? Stool DNA test. ? Virtual colonoscopy. ? Sigmoidoscopy. During this test, a flexible tube with a tiny camera (sigmoidoscope) is used to examine your rectum and lower colon. The sigmoidoscope is inserted through your anus into your rectum and lower colon. ? Colonoscopy. During this test, a long, thin, flexible tube with a tiny camera (colonoscope) is used to examine your entire colon and rectum.  Prostate cancer screening. Recommendations will vary depending on your family history and other risks.  Hepatitis C blood test.  Hepatitis B blood test.  Sexually transmitted disease (STD) testing.  Diabetes screening. This is done by checking your blood sugar (glucose) after you have not eaten for a while (fasting). You may have this done every 1-3 years. Discuss your test results, treatment options, and if necessary, the need for more tests with your health care provider. Vaccines Your health care provider may recommend certain vaccines, such as:  Influenza vaccine. This is recommended every year.  Tetanus, diphtheria, and acellular pertussis (Tdap, Td) vaccine. You may need a Td booster every 10 years.  Varicella vaccine. You may need this if you have not been vaccinated.  Zoster vaccine. You may need this after age 60.  Measles, mumps, and rubella (MMR) vaccine. You may need at least one dose of MMR if you were born in 1957 or later. You may also need a second dose.  Pneumococcal 13-valent conjugate (PCV13) vaccine. You may need this if you have certain conditions and have not been vaccinated.  Pneumococcal polysaccharide (PPSV23) vaccine.   You may need one or two doses if you smoke cigarettes or if you have certain conditions.  Meningococcal vaccine. You may need this if you have certain conditions.  Hepatitis A vaccine. You may need this if you have certain conditions or if you travel or work in  places where you may be exposed to hepatitis A.  Hepatitis B vaccine. You may need this if you have certain conditions or if you travel or work in places where you may be exposed to hepatitis B.  Haemophilus influenzae type b (Hib) vaccine. You may need this if you have certain risk factors. Talk to your health care provider about which screenings and vaccines you need and how often you need them. This information is not intended to replace advice given to you by your health care provider. Make sure you discuss any questions you have with your health care provider. Document Released: 12/02/2015 Document Revised: 12/26/2017 Document Reviewed: 09/06/2015 Elsevier Interactive Patient Education  2019 Elsevier Inc.  

## 2019-05-04 NOTE — Progress Notes (Signed)
Established Patient Office Visit  Subjective:  Patient ID: Robert Sexton, male    DOB: 03-25-1976  Age: 43 y.o. MRN: [id number]  CC:  Chief Complaint  Patient presents with  . Annual Exam    HPI Robert Sexton presents for CPE.  He is doing well overall.  He bought a treadmill and has been exercising regularly.  He did have some concerns about some right lower quadrant pain that is been going on since at least last July 2019.  In fact he went to urgent care about it.  He says since then it has continued it is been a most daily.  He says most the time it is dull but occasionally will feel like a sharp pain.  It is worse if he stretches or when he is laying back from a sitting position.  He feels like his bowel movements have been normal he does not strain.  No hard stools.  No blood in the urine.  He did have a CT done at that time which was essentially negative with no worrisome findings.  But more recently his sister who is 34 was diagnosed with a precancerous polyp and per his report she was told to make sure that any siblings got tested and evaluated with colonoscopy.    Past Medical History:  Diagnosis Date  . PANIC ATTACK 10/05/2010   Qualifier: Diagnosis of  By: Madilyn Fireman MD, Barnetta Chapel      Past Surgical History:  Procedure Laterality Date  . WISDOM TOOTH EXTRACTION      Family History  Problem Relation Age of Onset  . Atrial fibrillation Mother 73  . Lymphoma Father 70  . Colon polyps Sister        Pre-cancerous at age 53.     Social History   Socioeconomic History  . Marital status: Married    Spouse name: Not on file  . Number of children: 2  . Years of education: Not on file  . Highest education level: Not on file  Occupational History  . Not on file  Social Needs  . Financial resource strain: Not on file  . Food insecurity    Worry: Not on file    Inability: Not on file  . Transportation needs    Medical: Not on file    Non-medical: Not on file  Tobacco  Use  . Smoking status: Never Smoker  . Smokeless tobacco: Never Used  Substance and Sexual Activity  . Alcohol use: Yes    Alcohol/week: 2.0 standard drinks    Types: 2 Standard drinks or equivalent per week  . Drug use: No  . Sexual activity: Not on file  Lifestyle  . Physical activity    Days per week: Not on file    Minutes per session: Not on file  . Stress: Not on file  Relationships  . Social Herbalist on phone: Not on file    Gets together: Not on file    Attends religious service: Not on file    Active member of club or organization: Not on file    Attends meetings of clubs or organizations: Not on file    Relationship status: Not on file  . Intimate partner violence    Fear of current or ex partner: Not on file    Emotionally abused: Not on file    Physically abused: Not on file    Forced sexual activity: Not on file  Other Topics Concern  . Not on  file  Social History Narrative   Play bball sometimes for exercise. Has 2 sons      Outpatient Medications Prior to Visit  Medication Sig Dispense Refill  . Multiple Vitamin (MULTIVITAMIN) tablet Take 1 tablet by mouth daily.    . metoprolol tartrate (LOPRESSOR) 25 MG tablet Take 1 tablet (25 mg total) by mouth 2 (two) times daily. LAST REFILL.[place]. PLEASE CALL AND SCHEDULE AN APPOINTMENT. 60 tablet 0   No facility-administered medications prior to visit.     Allergies  Allergen Reactions  . Penicillins Anaphylaxis    ROS Review of Systems    Objective:    Physical Exam  Constitutional: He is oriented to person, place, and time. He appears well-developed and well-nourished.  HENT:  Head: Normocephalic and atraumatic.  Right Ear: External ear normal.  Left Ear: External ear normal.  Nose: Nose normal.  Mouth/Throat: Oropharynx is clear and moist.  Eyes: Pupils are equal, round, and reactive to light. Conjunctivae and EOM are normal.  Neck: Normal range of motion. Neck supple. No  thyromegaly present.  Cardiovascular: Normal rate, regular rhythm, normal heart sounds and intact distal pulses.  Pulmonary/Chest: Effort normal and breath sounds normal.  Abdominal: Soft. Bowel sounds are normal. He exhibits no distension and no mass. There is abdominal tenderness. There is no rebound and no guarding.  TTP in the RLQ with deep palpation. No fullness or swelling or bulging.   Musculoskeletal: Normal range of motion.  Lymphadenopathy:    He has no cervical adenopathy.  Neurological: He is alert and oriented to person, place, and time. He has normal reflexes.  Skin: Skin is warm and dry.  Psychiatric: He has a normal mood and affect. His behavior is normal. Judgment and thought content normal.    BP 111/69   Pulse 96   Ht 6\' 3"  (1.905 m)   Wt 198 lb (89.8 kg)   SpO2 99%   BMI 24.75 kg/m  Wt Readings from Last 3 Encounters:  05/04/19 198 lb (89.8 kg)  05/21/18 193 lb (87.5 kg)  02/25/18 194 lb (88 kg)     There are no preventive care reminders to display for this patient.  There are no preventive care reminders to display for this patient.  Lab Results  Component Value Date   TSH 1.266 07/20/2014   Lab Results  Component Value Date   WBC 6.3 02/25/2018   HGB 13.9 02/25/2018   HCT 40.5 02/25/2018   MCV 83.9 02/25/2018   PLT 251 02/25/2018   Lab Results  Component Value Date   NA 138 05/21/2018   K 4.4 05/21/2018   CO2 27 05/21/2018   GLUCOSE 92 05/21/2018   BUN 14 05/21/2018   CREATININE 1.00 05/21/2018   BILITOT 0.9 05/21/2018   ALKPHOS 41 09/25/2016   AST 14 05/21/2018   ALT 14 05/21/2018   PROT 7.2 05/21/2018   ALBUMIN 4.3 09/25/2016   CALCIUM 9.6 05/21/2018   Lab Results  Component Value Date   CHOL 162 02/25/2018   Lab Results  Component Value Date   HDL 42 02/25/2018   Lab Results  Component Value Date   LDLCALC 104 (H) 02/25/2018   Lab Results  Component Value Date   TRIG 75 02/25/2018   Lab Results  Component Value Date    CHOLHDL 3.9 02/25/2018   No results found for: HGBA1C    Assessment & Plan:   Problem List Items Addressed This Visit    None  Visit Diagnoses    Wellness examination    -  Primary   Relevant Orders   COMPLETE METABOLIC PANEL WITH GFR   Lipid panel   CBC   Chronic RLQ pain       Relevant Orders   Ambulatory referral to Gastroenterology       Keep up a regular exercise program and make sure you are eating a healthy diet Try to eat 4 servings of dairy a day, or if you are lactose intolerant take a calcium with vitamin D daily.  Your vaccines are up to date.   Right lower quadrant pain-unclear etiology.  He does have some mild tenderness with deep palpation on exam.  It seems to be chronic.  He is concerned about potential for colon cancer etc.  Especially with sister's recent diagnosis of a precancerous polyp we discussed moving forward with evaluation by GI and or possibly considering an early colonoscopy.  Meds ordered this encounter  Medications  . metoprolol tartrate (LOPRESSOR) 25 MG tablet    Sig: Take 1 tablet (25 mg total) by mouth 2 (two) times daily as needed.    Dispense:  60 tablet    Refill:  0    Follow-up: Return in about 1 year (around 05/03/2020) for Wellness Exam .    Beatrice Lecher, MD

## 2019-05-04 NOTE — Telephone Encounter (Signed)
lvm advising pt that he can schedule an appt to get  Twinrix done with a nurse and RTC in 5 mos afterwards to have last vaccine.Audelia Hives [place]

## 2019-05-05 LAB — LIPID PANEL
Cholesterol: 180 mg/dL (ref <200)
HDL: 39 mg/dL — ABNORMAL LOW (ref >=40)
LDL Cholesterol (Calc): 120 mg/dL (calc) — ABNORMAL HIGH
Non-HDL Cholesterol (Calc): 141 mg/dL (calc) — ABNORMAL HIGH (ref <130)
Total CHOL/HDL Ratio: 4.6 (calc) (ref <5.0)
Triglycerides: 106 mg/dL (ref <150)

## 2019-05-05 LAB — COMPLETE METABOLIC PANEL WITH GFR
AG Ratio: 1.8 (calc) (ref 1.0–2.5)
ALT: 18 U/L (ref 9–46)
AST: 15 U/L (ref 10–40)
Albumin: 4.5 g/dL (ref 3.6–5.1)
Alkaline phosphatase (APISO): 44 U/L (ref 36–130)
BUN: 16 mg/dL (ref 7–25)
CO2: 29 mmol/L (ref 20–32)
Calcium: 9.9 mg/dL (ref 8.6–10.3)
Chloride: 102 mmol/L (ref 98–110)
Creat: 1.11 mg/dL (ref 0.60–1.35)
GFR, Est African American: 94 mL/min/{1.73_m2} (ref >=60)
GFR, Est Non African American: 81 mL/min/{1.73_m2} (ref >=60)
Globulin: 2.5 g/dL (calc) (ref 1.9–3.7)
Glucose, Bld: 89 mg/dL (ref 65–99)
Potassium: 4.7 mmol/L (ref 3.5–5.3)
Sodium: 138 mmol/L (ref 135–146)
Total Bilirubin: 0.8 mg/dL (ref 0.2–1.2)
Total Protein: 7 g/dL (ref 6.1–8.1)

## 2019-05-05 LAB — CBC
HCT: 44.8 % (ref 38.5–50.0)
Hemoglobin: 14.5 g/dL (ref 13.2–17.1)
MCH: 28 pg (ref 27.0–33.0)
MCHC: 32.4 g/dL (ref 32.0–36.0)
MCV: 86.5 fL (ref 80.0–100.0)
MPV: 9.7 fL (ref 7.5–12.5)
Platelets: 253 10*3/uL (ref 140–400)
RBC: 5.18 10*6/uL (ref 4.20–5.80)
RDW: 12.4 % (ref 11.0–15.0)
WBC: 4.4 10*3/uL (ref 3.8–10.8)

## 2019-05-08 ENCOUNTER — Other Ambulatory Visit: Payer: Self-pay

## 2019-05-08 ENCOUNTER — Telehealth (INDEPENDENT_AMBULATORY_CARE_PROVIDER_SITE_OTHER): Payer: 59 | Admitting: Gastroenterology

## 2019-05-08 ENCOUNTER — Encounter: Payer: Self-pay | Admitting: Gastroenterology

## 2019-05-08 VITALS — Ht 77.0 in | Wt 195.0 lb

## 2019-05-08 DIAGNOSIS — Z8371 Family history of colonic polyps: Secondary | ICD-10-CM | POA: Diagnosis not present

## 2019-05-08 DIAGNOSIS — R1031 Right lower quadrant pain: Secondary | ICD-10-CM

## 2019-05-08 MED ORDER — CLENPIQ 10-3.5-12 MG-GM -GM/160ML PO SOLN: 1.0000 | ORAL | 0 refills | Status: DC

## 2019-05-08 NOTE — Patient Instructions (Signed)
If you are age 43 or older, your body mass index should be between 23-30. Your Body mass index is 23.12 kg/m. If this is out of the aforementioned range listed, please consider follow up with your Primary Care Provider.  If you are age 28 or younger, your body mass index should be between 19-25. Your Body mass index is 23.12 kg/m. If this is out of the aformentioned range listed, please consider follow up with your Primary Care Provider.   To help prevent the possible spread of infection to our patients, communities, and staff; we will be implementing the following measures:  As of now we are not allowing any visitors/family members to accompany you to any upcoming appointments with [facility]. If you have any concerns about this please contact our office to discuss prior to the appointment.   You have been scheduled for a colonoscopy. Please follow written instructions given to you at your visit today.  Please pick up your prep supplies at the pharmacy within the next 1-3 days. If you use inhalers (even only as needed), please bring them with you on the day of your procedure. Your physician has requested that you go to www.startemmi.com and enter the access code given to you at your visit today. This web site gives a general overview about your procedure. However, you should still follow specific instructions given to you by our office regarding your preparation for the procedure.  We have sent the following medications to your pharmacy for you to pick up at your convenience: Clenpiq  It was a pleasure to see you today!  Vito Cirigliano, D.O.

## 2019-05-08 NOTE — Progress Notes (Signed)
Chief Complaint: RLQ pain, family history of advanced colon polyp  Referring Provider:     Hali Marry, MD  HPI:    Due to current restrictions/limitations of in-office visits due to the COVID-19 pandemic, this scheduled clinical appointment was converted to a telehealth virtual consultation using D[employer].  -The patient did consent to this virtual visit and is aware of possible charges through their insurance for this visit.  -Names of all parties present: Robert Sexton (patient), Gerrit Heck, DO, [facility] (physician) -Patient location: Home -Physician location: Office  Robert Sexton is a 43 y.o. male referred to the [facility] for evaluation of RLQ pain.  Pain is been ongoing since 05/2018.  Has been evaluated in the urgent care for this. CT in 05/2018 was normal. Essentially off/on pain, present most days described as a dull ache with times of exacerbating sharp pain.  No radiation. Worse with stretching arms up or laying on his back.  No associated change in bowel habits, hematochezia, melena. Pain wosening lately, with increased frequency.   Did have TTP in RLQ on recent exam by PCM.  No rebound or guarding or peritoneal signs. CBC and CMP normal.   Family history notable for sister with a precancerous polyp at age 73, with advice to test siblings early with colonoscopy. Father deceased age 50 Lymphoma. No other known FHx of CRC, GI malignancy, IBD.   No previous EGD. Had colonoscopy in 2-006 for diarrhea, and told was normal.    Past medical history, past surgical history, social history, family history, medications, and allergies reviewed in the chart and with patient.    Past Medical History:  Diagnosis Date  . PANIC ATTACK 10/05/2010   Qualifier: Diagnosis of  By: Madilyn Fireman MD, Barnetta Chapel       Past Surgical History:  Procedure Laterality Date  . COLONOSCOPY  2006  . WISDOM TOOTH EXTRACTION     Family History  Problem Relation Age of Onset   . Atrial fibrillation Mother 70  . Lymphoma Father 63  . Colon polyps Sister        Pre-cancerous at age 23.   . Colon cancer Neg Hx    Social History   Tobacco Use  . Smoking status: Never Smoker  . Smokeless tobacco: Never Used  Substance Use Topics  . Alcohol use: Yes    Alcohol/week: 2.0 standard drinks    Types: 2 Standard drinks or equivalent per week  . Drug use: No   Current Outpatient Medications  Medication Sig Dispense Refill  . metoprolol tartrate (LOPRESSOR) 25 MG tablet Take 1 tablet (25 mg total) by mouth 2 (two) times daily as needed. 60 tablet 0  . Multiple Vitamin (MULTIVITAMIN) tablet Take 1 tablet by mouth daily.     No current facility-administered medications for this visit.    Allergies  Allergen Reactions  . Penicillins Anaphylaxis     Review of Systems: All systems reviewed and negative except where noted in HPI.     Physical Exam:    Complete physical exam not completed due to the nature of this telehealth communication.   Gen: Awake, alert, and oriented, and well communicative. HEENT: EOMI, non-icteric sclera, NCAT, MMM Neck: Normal movement of head and neck Pulm: No labored breathing, speaking in full sentences without conversational dyspnea GI: Nondistended.  Had patient lie supine and palpate RLQ which he described minimal TTP at rest.  Carnett's sign seemed positive by his  self exam. Derm: No apparent lesions or bruising in visible field MS: Moves all visible extremities without noticeable abnormality Psych: Pleasant, cooperative, normal speech, thought processing seemingly intact   ASSESSMENT AND PLAN;   1) RLQ pain: Lingering RLQ pain for nearly a year, but increasing in frequency and intensity.  Clinical presentation and telehealth exam today suspicious for abdominal wall syndrome.  However, in light of patient's age, family history, ongoing symptoms, heightened concerns, would like to evaluate further with colonoscopy to rule out  mucosal/luminal etiology.  If colonoscopy unrevealing, and symptoms persist, can plan for abdominal wall injection.  2) Family history of advanced colon polyps: Sister with advanced polyp diagnosed earlier this year at age 22, with recommendation from her physician to screen first-degree relatives early for CRC/advanced polyps.  Given family history, along with active GI symptoms and heightened patient concerns, agree with plan to proceed with colonoscopy for early CRC screening.  If unrevealing, can resume routine 10-year interval.  - Schedule for colonoscopy -RTC after colonoscopy  The indications, risks, and benefits of colonoscopy were explained to the patient in detail. Risks include but are not limited to bleeding, perforation, adverse reaction to medications, and cardiopulmonary compromise. Sequelae include but are not limited to the possibility of surgery, hospitalization, and mortality. The patient verbalized understanding and wished to proceed. All questions answered, referred to the scheduler and bowel prep ordered. Further recommendations pending results of the exam.    Lavena Bullion, DO, FACG  05/08/2019, 3:14 PM   Madilyn Fireman Rene Kocher, *

## 2019-05-15 ENCOUNTER — Encounter: Payer: Self-pay | Admitting: Gastroenterology

## 2019-05-18 ENCOUNTER — Telehealth: Payer: Self-pay | Admitting: Gastroenterology

## 2019-05-18 NOTE — Telephone Encounter (Signed)
Patient called and stated that the prep was to expensive.

## 2019-05-26 ENCOUNTER — Telehealth: Payer: Self-pay | Admitting: Gastroenterology

## 2019-05-26 NOTE — Telephone Encounter (Signed)

## 2019-05-26 NOTE — Telephone Encounter (Signed)
No to all answer °

## 2019-05-27 ENCOUNTER — Encounter: Payer: Self-pay | Admitting: Gastroenterology

## 2019-05-27 ENCOUNTER — Ambulatory Visit (AMBULATORY_SURGERY_CENTER): Payer: 59 | Admitting: Gastroenterology

## 2019-05-27 ENCOUNTER — Other Ambulatory Visit: Payer: Self-pay

## 2019-05-27 VITALS — BP 106/50 | HR 82 | Temp 98.8°F | Resp 18 | Ht 77.0 in | Wt 195.0 lb

## 2019-05-27 DIAGNOSIS — D122 Benign neoplasm of ascending colon: Secondary | ICD-10-CM

## 2019-05-27 DIAGNOSIS — K639 Disease of intestine, unspecified: Secondary | ICD-10-CM

## 2019-05-27 DIAGNOSIS — K635 Polyp of colon: Secondary | ICD-10-CM

## 2019-05-27 DIAGNOSIS — Z8371 Family history of colonic polyps: Secondary | ICD-10-CM

## 2019-05-27 DIAGNOSIS — G8929 Other chronic pain: Secondary | ICD-10-CM

## 2019-05-27 MED ORDER — SODIUM CHLORIDE 0.9 % IV SOLN: 500.0000 mL | Freq: Once | INTRAVENOUS | Status: DC

## 2019-05-27 NOTE — Op Note (Signed)
[facility] Patient Name: Robert Sexton Procedure Date: 05/27/2019 3:04 PM MRN: [id number] Endoscopist: Gerrit Heck , MD Age: 43 Referring MD:  Date of Birth: 1976-03-11 Gender: Male Account #: [id number] Procedure:                Colonoscopy Indications:              Abdominal pain in the right lower quadrant. Family                            history notable for sister with advanced colon                            polyp at age 16. Medicines:                Monitored Anesthesia Care Procedure:                Pre-Anesthesia Assessment:                           - Prior to the procedure, a History and Physical                            was performed, and patient medications and                            allergies were reviewed. The patient's tolerance of                            previous anesthesia was also reviewed. The risks                            and benefits of the procedure and the sedation                            options and risks were discussed with the patient.                            All questions were answered, and informed consent                            was obtained. Prior Anticoagulants: The patient has                            taken no previous anticoagulant or antiplatelet                            agents. ASA Grade Assessment: II - A patient with                            mild systemic disease. After reviewing the risks                            and benefits, the patient was deemed in  satisfactory condition to undergo the procedure.                           After obtaining informed consent, the colonoscope                            was passed under direct vision. Throughout the                            procedure, the patient's blood pressure, pulse, and                            oxygen saturations were monitored continuously. The                            Colonoscope was introduced through the anus and                             advanced to the the cecum, identified by                            appendiceal orifice and ileocecal valve. The                            colonoscopy was performed without difficulty. The                            patient tolerated the procedure well. The quality                            of the bowel preparation was adequate. The                            ileocecal valve, appendiceal orifice, and rectum                            were photographed. Scope In: 3:18:46 PM Scope Out: 3:43:14 PM Scope Withdrawal Time: 0 hours 19 minutes 22 seconds  Total Procedure Duration: 0 hours 24 minutes 28 seconds  Findings:                 The perianal and digital rectal examinations were                            normal.                           A 8 mm polyp was found in the ascending colon. The                            polyp was flat. The polyp was removed with a saline                            injection-lift technique using a cold snare.  Resection and retrieval were complete. To prevent                            bleeding after the polypectomy, one hemostatic clip                            was successfully placed. There was no bleeding at                            the end of the procedure.                           Localized mild inflammation characterized by                            congestion (edema) and erythema was found in the                            sigmoid colon, located 18-20 cm from the anal                            verge. Biopsies were taken with a cold forceps for                            histology. Estimated blood loss was minimal.                           The exam was otherwise normal throughout the                            remainder of the colon. Unable to intubate the                            terminal ileum.                           The retroflexed view of the distal rectum and anal                             verge was normal and showed no anal or rectal                            abnormalities. Complications:            No immediate complications. Estimated Blood Loss:     Estimated blood loss was minimal. Impression:               - One 8 mm polyp in the ascending colon, removed                            using injection-lift and a cold snare. Resected and                            retrieved. Clip was placed.                           -  Localized mild inflammation was found in the                            sigmoid colon. Biopsied.                           - The distal rectum and anal verge are normal on                            retroflexion view. Recommendation:           - Patient has a contact number available for                            emergencies. The signs and symptoms of potential                            delayed complications were discussed with the                            patient. Return to normal activities tomorrow.                            Written discharge instructions were provided to the                            patient.                           - Resume previous diet.                           - Continue present medications.                           - Await pathology results.                           - Repeat colonoscopy in 5 years for surveillance                            due to family history and polyp noted on this study.                           - Return to GI office PRN.                           - If return of RLQ pain, plan on in-office                            evaluation with consideration for abdominal wall                            injection for treatment of possible Abdominal Wall  Syndrome/Anterior Cutaneous Nerve Entrapment                            Syndrome. Gerrit Heck, MD 05/27/2019 3:52:31 PM

## 2019-05-27 NOTE — Progress Notes (Signed)
Called to room to assist during endoscopic procedure.  Patient ID and intended procedure confirmed with present staff. Received instructions for my participation in the procedure from the performing physician.  

## 2019-05-27 NOTE — Patient Instructions (Signed)
Information on polyps given to you today  A clip was placed  to prevent bleeding at the polypectomy site in the Ascending colon - a card to carry in your wallet was given to you today to indicate you have this clip in place    Await pathology results on polyp removed today and of the biopsies taken in the sigmoid colon   YOU HAD AN ENDOSCOPIC PROCEDURE TODAY AT [facility]:   Refer to the procedure report that was given to you for any specific questions about what was found during the examination.  If the procedure report does not answer your questions, please call your gastroenterologist to clarify.  If you requested that your care partner not be given the details of your procedure findings, then the procedure report has been included in a sealed envelope for you to review at your convenience later.  YOU SHOULD EXPECT: Some feelings of bloating in the abdomen. Passage of more gas than usual.  Walking can help get rid of the air that was put into your GI tract during the procedure and reduce the bloating. If you had a lower endoscopy (such as a colonoscopy or flexible sigmoidoscopy) you may notice spotting of blood in your stool or on the toilet paper. If you underwent a bowel prep for your procedure, you may not have a normal bowel movement for a few days.  Please Note:  You might notice some irritation and congestion in your nose or some drainage.  This is from the oxygen used during your procedure.  There is no need for concern and it should clear up in a day or so.  SYMPTOMS TO REPORT IMMEDIATELY:   Following lower endoscopy (colonoscopy or flexible sigmoidoscopy):  Excessive amounts of blood in the stool  Significant tenderness or worsening of abdominal pains  Swelling of the abdomen that is new, acute  Fever of 100F or higher  F  For urgent or emergent issues, a gastroenterologist can be reached at any hour by calling [phone].   DIET:  We do recommend a  small meal at first, but then you may proceed to your regular diet.  Drink plenty of fluids but you should avoid alcoholic beverages for 24 hours.  ACTIVITY:  You should plan to take it easy for the rest of today and you should NOT DRIVE or use heavy machinery until tomorrow (because of the sedation medicines used during the test).    FOLLOW UP: Our staff will call the number listed on your records 48-72 hours following your procedure to check on you and address any questions or concerns that you may have regarding the information given to you following your procedure. If we do not reach you, we will leave a message.  We will attempt to reach you two times.  During this call, we will ask if you have developed any symptoms of COVID 19. If you develop any symptoms (ie: fever, flu-like symptoms, shortness of breath, cough etc.) before then, please call [phone].  If you test positive for Covid 19 in the 2 weeks post procedure, please call and report this information to [place].    If any biopsies were taken you will be contacted by phone or by letter within the next 1-3 weeks.  Please call us at [phone] if you have not heard about the biopsies in 3 weeks.    SIGNATURES/CONFIDENTIALITY: You and/or your care partner have signed paperwork which will be entered into your electronic  medical record.  These signatures attest to the fact that that the information above on your After Visit Summary has been reviewed and is understood.  Full responsibility of the confidentiality of this discharge information lies with you and/or your care-partner.

## 2019-05-27 NOTE — Progress Notes (Signed)
A and O x3. Report to RN. Tolerated MAC anesthesia well.

## 2019-05-29 ENCOUNTER — Telehealth: Payer: Self-pay | Admitting: *Deleted

## 2019-05-29 NOTE — Telephone Encounter (Signed)
  Follow up Call-  Call back number 05/27/2019  Post procedure Call Back phone  # [phone]  Permission to leave phone message Yes  Some recent data might be hidden     Patient questions:  Do you have a fever, pain , or abdominal swelling? No. Pain Score  0 *  Have you tolerated food without any problems? Yes.    Have you been able to return to your normal activities? Yes.    Do you have any questions about your discharge instructions: Diet   No. Medications  No. Follow up visit  No.  Do you have questions or concerns about your Care? No.  Actions: * If pain score is 4 or above: No action needed, pain <4.  1. Have you developed a fever since your procedure? NO  2.   Have you had an respiratory symptoms (SOB or cough) since your procedure? NO  3.   Have you tested positive for COVID 19 since your procedure NO  4.   Have you had any family members/close contacts diagnosed with the COVID 19 since your procedure?  NO   If yes to any of these questions please route to Joylene John, RN and Alphonsa Gin, RN.

## 2019-05-31 ENCOUNTER — Other Ambulatory Visit: Payer: Self-pay | Admitting: Family Medicine

## 2019-06-05 ENCOUNTER — Encounter: Payer: Self-pay | Admitting: Gastroenterology

## 2019-08-20 ENCOUNTER — Encounter: Payer: Self-pay | Admitting: Family Medicine

## 2019-12-28 ENCOUNTER — Ambulatory Visit: Payer: 59 | Admitting: Family Medicine

## 2020-05-03 ENCOUNTER — Encounter: Payer: 59 | Admitting: Family Medicine

## 2020-06-02 ENCOUNTER — Encounter: Payer: Self-pay | Admitting: Family Medicine

## 2020-06-02 ENCOUNTER — Ambulatory Visit (INDEPENDENT_AMBULATORY_CARE_PROVIDER_SITE_OTHER): Payer: 59 | Admitting: Family Medicine

## 2020-06-02 VITALS — BP 99/58 | HR 87 | Ht 77.0 in | Wt 185.0 lb

## 2020-06-02 DIAGNOSIS — R109 Unspecified abdominal pain: Secondary | ICD-10-CM

## 2020-06-02 DIAGNOSIS — Z8616 Personal history of COVID-19: Secondary | ICD-10-CM | POA: Diagnosis not present

## 2020-06-02 DIAGNOSIS — Z1159 Encounter for screening for other viral diseases: Secondary | ICD-10-CM

## 2020-06-02 DIAGNOSIS — Z0000 Encounter for general adult medical examination without abnormal findings: Secondary | ICD-10-CM | POA: Diagnosis not present

## 2020-06-02 NOTE — Progress Notes (Signed)
CPE  Established Patient Office Visit  Subjective:  Patient ID: Robert Sexton, male    DOB: 1976-04-28  Age: 44 y.o. MRN: [id number]  CC:  Chief Complaint  Patient presents with  . Annual Exam    HPI Robert Sexton presents for CPE.  He is doing well overall.  He still exercising a couple of days a week but not as consistently as he was when Covid first hit.  He is still having some difficulty with some intermittent right lower quadrant pain has been going on for a couple of years now in fact he has seen GI is had a colonoscopy and even had a CT scan via urgent care.  He says the pain has changed in nature recently.  It is been radiating upward into the right upper quadrant towards the rib cage.  He denies any known triggers.  He says he usually tries to lie down when it happens he describes it as a dull pain though occasionally can be sharp especially in that right lower quadrant.  He denies any nausea vomiting or diarrhea or urinary symptoms.  No dizziness or lightheadedness.  He says it usually lasts a couple of hours when it happens.  He did get diagnosed with Covid in December.  Past Medical History:  Diagnosis Date  . PANIC ATTACK 10/05/2010   Qualifier: Diagnosis of  By: Madilyn Fireman MD, Barnetta Chapel      Past Surgical History:  Procedure Laterality Date  . COLONOSCOPY  2006  . WISDOM TOOTH EXTRACTION      Family History  Problem Relation Age of Onset  . Atrial fibrillation Mother 47  . Lymphoma Father 101  . Colon polyps Sister        Pre-cancerous at age 43.   . Colon cancer Neg Hx     Social History   Socioeconomic History  . Marital status: Married    Spouse name: Not on file  . Number of children: 2  . Years of education: Not on file  . Highest education level: Not on file  Occupational History  . Not on file  Tobacco Use  . Smoking status: Never Smoker  . Smokeless tobacco: Never Used  Vaping Use  . Vaping Use: Never used  Substance and Sexual Activity  . Alcohol  use: Yes    Alcohol/week: 2.0 standard drinks    Types: 2 Standard drinks or equivalent per week  . Drug use: No  . Sexual activity: Not on file  Other Topics Concern  . Not on file  Social History Narrative   Play bball sometimes for exercise. Has 2 sons     Social Determinants of Radio broadcast assistant Strain:   . Difficulty of Paying Living Expenses:   Food Insecurity:   . Worried About Charity fundraiser in the Last Year:   . Arboriculturist in the Last Year:   Transportation Needs:   . Film/video editor (Medical):   Marland Kitchen Lack of Transportation (Non-Medical):   Physical Activity:   . Days of Exercise per Week:   . Minutes of Exercise per Session:   Stress:   . Feeling of Stress :   Social Connections:   . Frequency of Communication with Friends and Family:   . Frequency of Social Gatherings with Friends and Family:   . Attends Religious Services:   . Active Member of Clubs or Organizations:   . Attends Archivist Meetings:   Marland Kitchen Marital Status:  Intimate Partner Violence:   . Fear of Current or Ex-Partner:   . Emotionally Abused:   Marland Kitchen Physically Abused:   . Sexually Abused:     Outpatient Medications Prior to Visit  Medication Sig Dispense Refill  . metoprolol tartrate (LOPRESSOR) 25 MG tablet TAKE 1 TABLET (25 MG TOTAL) BY MOUTH 2 (TWO) TIMES DAILY AS NEEDED. 60 tablet 1  . Multiple Vitamin (MULTIVITAMIN) tablet Take 1 tablet by mouth daily.     No facility-administered medications prior to visit.    Allergies  Allergen Reactions  . Penicillins Anaphylaxis    ROS Review of Systems    Objective:    Physical Exam Constitutional:      Appearance: He is well-developed.  HENT:     Head: Normocephalic and atraumatic.     Right Ear: External ear normal.     Left Ear: External ear normal.     Nose: Nose normal.  Eyes:     Conjunctiva/sclera: Conjunctivae normal.     Pupils: Pupils are equal, round, and reactive to light.  Neck:      Thyroid: No thyromegaly.  Cardiovascular:     Rate and Rhythm: Normal rate and regular rhythm.     Heart sounds: Normal heart sounds.  Pulmonary:     Effort: Pulmonary effort is normal.     Breath sounds: Normal breath sounds.  Abdominal:     General: Bowel sounds are normal. There is no distension.     Palpations: Abdomen is soft. There is no mass.     Tenderness: There is no abdominal tenderness. There is no guarding or rebound.  Musculoskeletal:        General: Normal range of motion.     Cervical back: Normal range of motion and neck supple.  Lymphadenopathy:     Cervical: No cervical adenopathy.  Skin:    General: Skin is warm and dry.  Neurological:     Mental Status: He is alert and oriented to person, place, and time.     Deep Tendon Reflexes: Reflexes are normal and symmetric.  Psychiatric:        Behavior: Behavior normal.        Thought Content: Thought content normal.        Judgment: Judgment normal.     BP (!) 99/58   Pulse 87   Ht 6\' 5"  (1.956 m)   Wt 185 lb (83.9 kg)   SpO2 99%   BMI 21.94 kg/m  Wt Readings from Last 3 Encounters:  06/02/20 185 lb (83.9 kg)  05/27/19 195 lb (88.5 kg)  05/08/19 195 lb (88.5 kg)     Health Maintenance Due  Topic Date Due  . Hepatitis C Screening  Never done    There are no preventive care reminders to display for this patient.  Lab Results  Component Value Date   TSH 1.266 07/20/2014   Lab Results  Component Value Date   WBC 4.4 05/04/2019   HGB 14.5 05/04/2019   HCT 44.8 05/04/2019   MCV 86.5 05/04/2019   PLT 253 05/04/2019   Lab Results  Component Value Date   NA 138 05/04/2019   K 4.7 05/04/2019   CO2 29 05/04/2019   GLUCOSE 89 05/04/2019   BUN 16 05/04/2019   CREATININE 1.11 05/04/2019   BILITOT 0.8 05/04/2019   ALKPHOS 41 09/25/2016   AST 15 05/04/2019   ALT 18 05/04/2019   PROT 7.0 05/04/2019   ALBUMIN 4.3 09/25/2016   CALCIUM 9.9 05/04/2019  Lab Results  Component Value Date   CHOL  180 05/04/2019   Lab Results  Component Value Date   HDL 39 (L) 05/04/2019   Lab Results  Component Value Date   LDLCALC 120 (H) 05/04/2019   Lab Results  Component Value Date   TRIG 106 05/04/2019   Lab Results  Component Value Date   CHOLHDL 4.6 05/04/2019   No results found for: HGBA1C    Assessment & Plan:   Problem List Items Addressed This Visit      Other   Right lateral abdominal pain   Relevant Orders   US Abdomen Complete   History of COVID-19    Other Visit Diagnoses    Routine general medical examination at a health care facility    -  Primary   Relevant Orders   CBC   Lipid Panel w/reflex Direct LDL   COMPLETE METABOLIC PANEL WITH GFR   Hepatitis C antibody   Encounter for hepatitis C screening test for low risk patient       Relevant Orders   CBC     Keep up a regular exercise program and make sure you are eating a healthy diet Try to eat 4 servings of dairy a day, or if you are lactose intolerant take a calcium with vitamin D daily.  Your vaccines are up to date.   Right lateral abdominal pain now radiating up into the right upper quadrant.  Unclear etiology I do feel like this is probably just a continuation of the persistent right lower pain that he has had since 2019.  No specific findings on colonoscopy or CT back in 2019.  We could certainly check his gallbladder he did not have any gallstones or thickening back in 2019 but will do an ultrasound I really want to avoid significant amounts of radiation.  If everything is reassuring then recommend continuing to monitor. No orders of the defined types were placed in this encounter.   Follow-up: Return in about 1 year (around 06/02/2021) for Wellness Exam.    Beatrice Lecher, MD

## 2020-06-02 NOTE — Patient Instructions (Signed)

## 2020-06-03 ENCOUNTER — Telehealth: Payer: Self-pay | Admitting: *Deleted

## 2020-06-03 LAB — CBC
HCT: 43.4 % (ref 38.5–50.0)
Hemoglobin: 14.4 g/dL (ref 13.2–17.1)
MCH: 28.6 pg (ref 27.0–33.0)
MCHC: 33.2 g/dL (ref 32.0–36.0)
MCV: 86.3 fL (ref 80.0–100.0)
MPV: 9.8 fL (ref 7.5–12.5)
Platelets: 248 10*3/uL (ref 140–400)
RBC: 5.03 10*6/uL (ref 4.20–5.80)
RDW: 12.3 % (ref 11.0–15.0)
WBC: 5.1 10*3/uL (ref 3.8–10.8)

## 2020-06-03 LAB — LIPID PANEL W/REFLEX DIRECT LDL
Cholesterol: 175 mg/dL (ref <200)
HDL: 45 mg/dL (ref >=40)
LDL Cholesterol (Calc): 115 mg/dL (calc) — ABNORMAL HIGH
Non-HDL Cholesterol (Calc): 130 mg/dL (calc) — ABNORMAL HIGH (ref <130)
Total CHOL/HDL Ratio: 3.9 (calc) (ref <5.0)
Triglycerides: 63 mg/dL (ref <150)

## 2020-06-03 LAB — COMPLETE METABOLIC PANEL WITH GFR
AG Ratio: 1.8 (calc) (ref 1.0–2.5)
ALT: 16 U/L (ref 9–46)
AST: 13 U/L (ref 10–40)
Albumin: 4.6 g/dL (ref 3.6–5.1)
Alkaline phosphatase (APISO): 41 U/L (ref 36–130)
BUN: 13 mg/dL (ref 7–25)
CO2: 28 mmol/L (ref 20–32)
Calcium: 9.8 mg/dL (ref 8.6–10.3)
Chloride: 104 mmol/L (ref 98–110)
Creat: 0.95 mg/dL (ref 0.60–1.35)
GFR, Est African American: 112 mL/min/{1.73_m2} (ref >=60)
GFR, Est Non African American: 97 mL/min/{1.73_m2} (ref >=60)
Globulin: 2.6 g/dL (calc) (ref 1.9–3.7)
Glucose, Bld: 83 mg/dL (ref 65–99)
Potassium: 5 mmol/L (ref 3.5–5.3)
Sodium: 141 mmol/L (ref 135–146)
Total Bilirubin: 0.5 mg/dL (ref 0.2–1.2)
Total Protein: 7.2 g/dL (ref 6.1–8.1)

## 2020-06-03 LAB — HEPATITIS C ANTIBODY
Hepatitis C Ab: NONREACTIVE
SIGNAL TO CUT-OFF: 0.01 (ref <1.00)

## 2020-06-03 NOTE — Telephone Encounter (Signed)
Form completed,faxed,confirmation received and scanned into patient's chart. 

## 2020-06-15 ENCOUNTER — Ambulatory Visit (HOSPITAL_BASED_OUTPATIENT_CLINIC_OR_DEPARTMENT_OTHER)
Admission: RE | Admit: 2020-06-15 | Discharge: 2020-06-15 | Disposition: A | Discharge status: Home / Self Care | Payer: 59 | Source: Ambulatory Visit | Attending: Family Medicine | Admitting: Family Medicine

## 2020-06-15 ENCOUNTER — Other Ambulatory Visit: Payer: Self-pay

## 2020-06-15 DIAGNOSIS — R109 Unspecified abdominal pain: Secondary | ICD-10-CM | POA: Diagnosis not present

## 2021-01-30 IMAGING — US US ABDOMEN COMPLETE
1 series · 14 of 25 positions shown · non-contrast
Comparison: CT abdomen and pelvis 05/21/2018

CLINICAL DATA: RIGHT lateral abdominal pain radiating to RIGHT
upper quadrant

EXAM:
ABDOMEN ULTRASOUND COMPLETE

[Series 1: us abdomen complete · 14 of 63 slices shown]
[im 1/63]
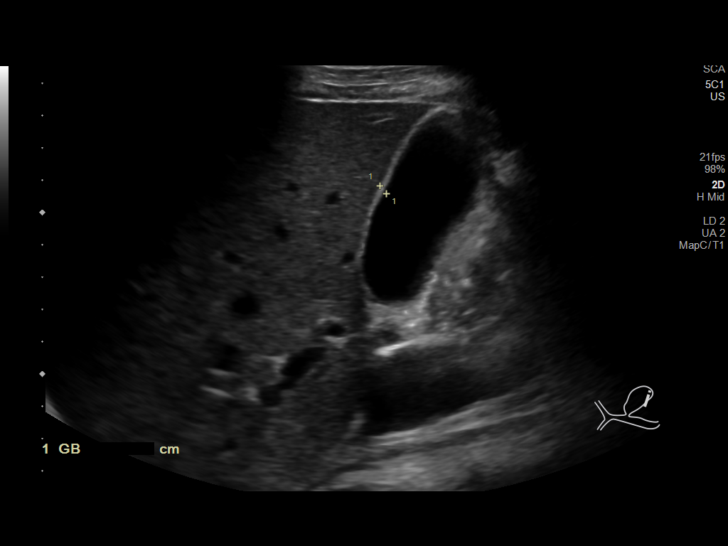
[im 6/63]
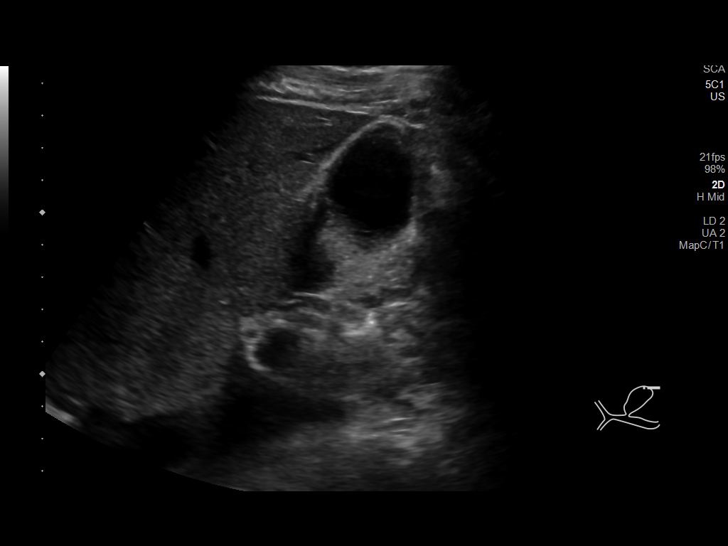
[im 11/63]
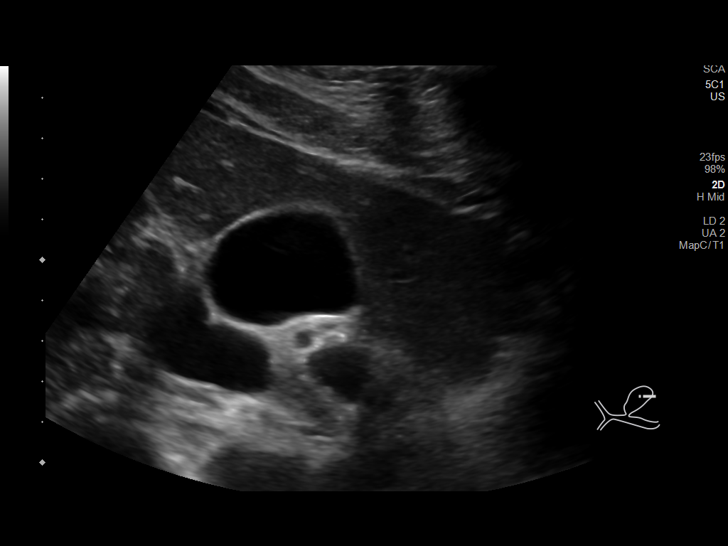
[im 16/63]
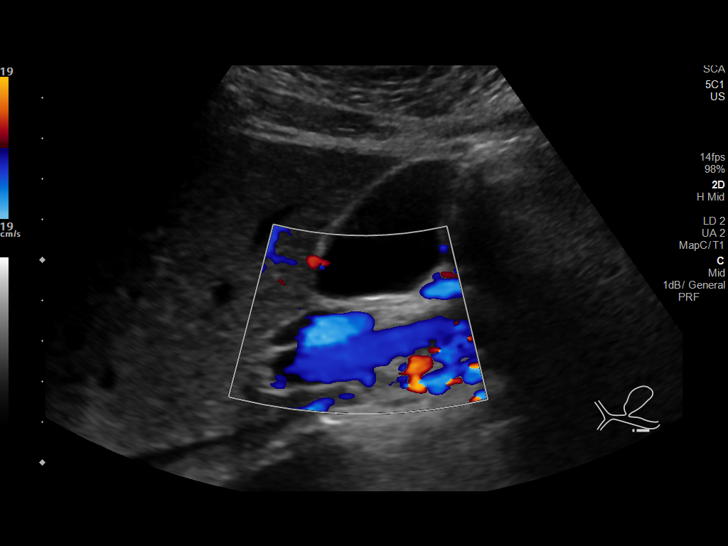
[im 21/63]
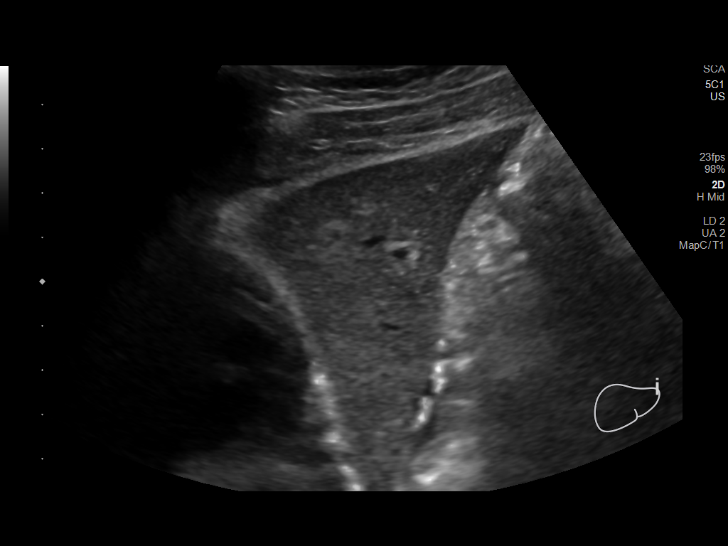
[im 24/63]
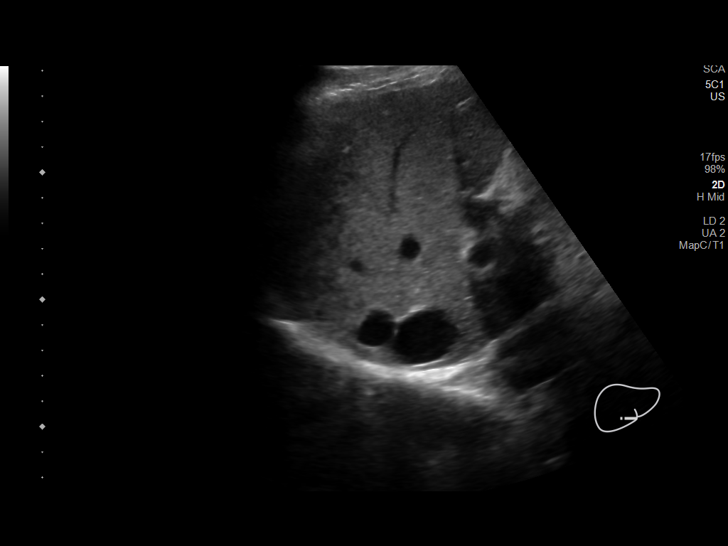
[im 29/63]
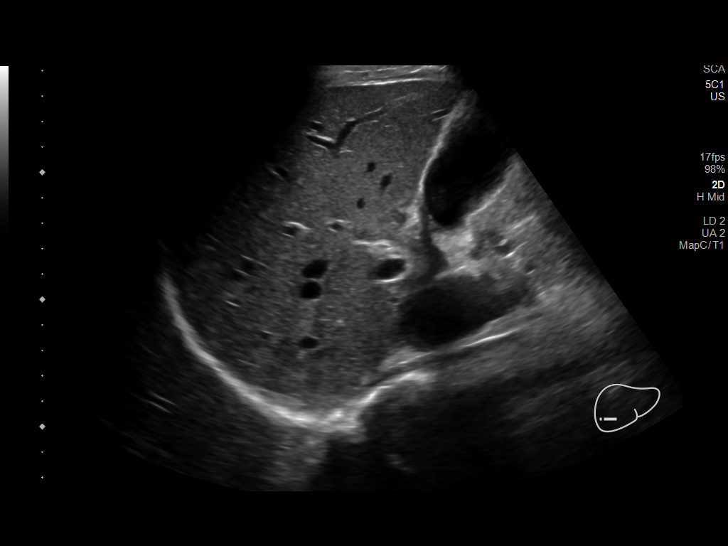
[im 34/63]
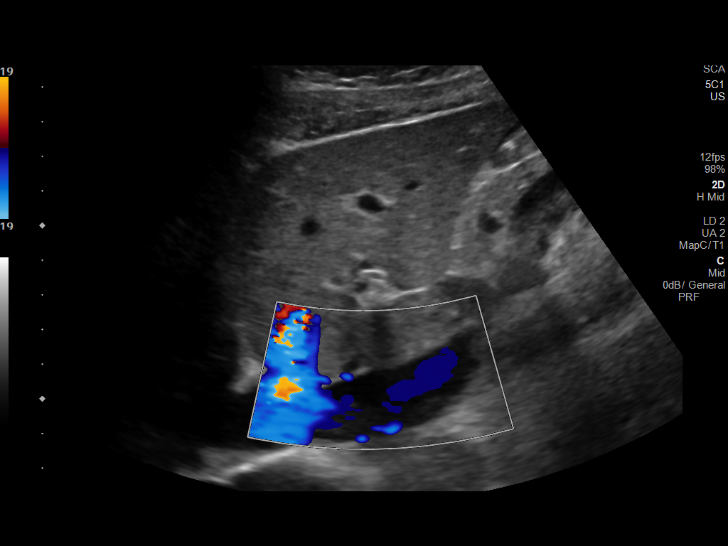
[im 39/63]
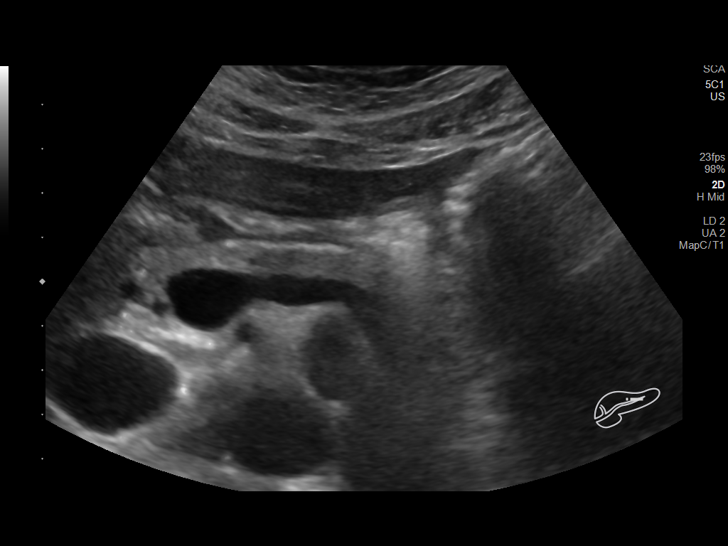
[im 42/63]
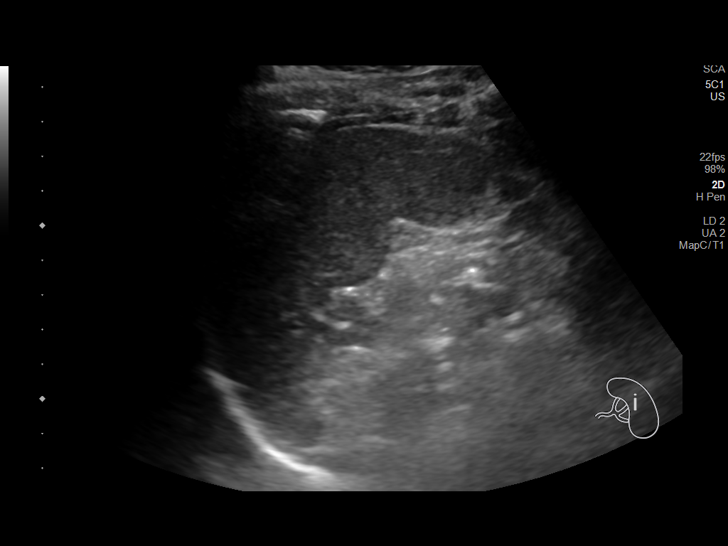
[im 47/63]
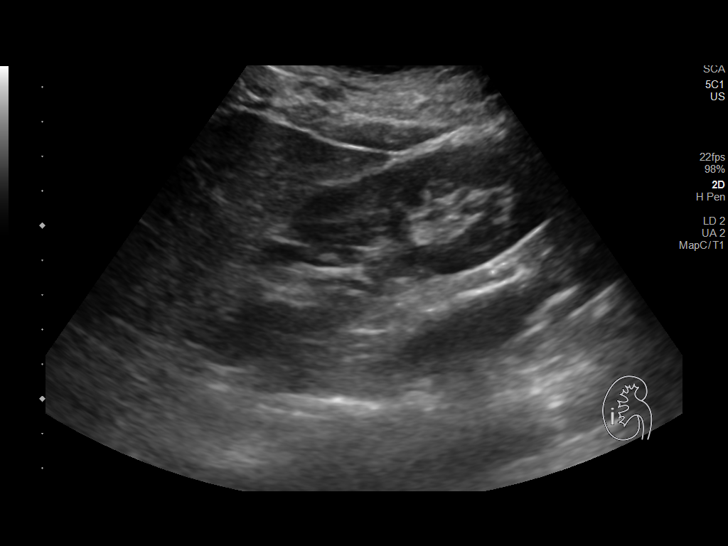
[im 52/63]
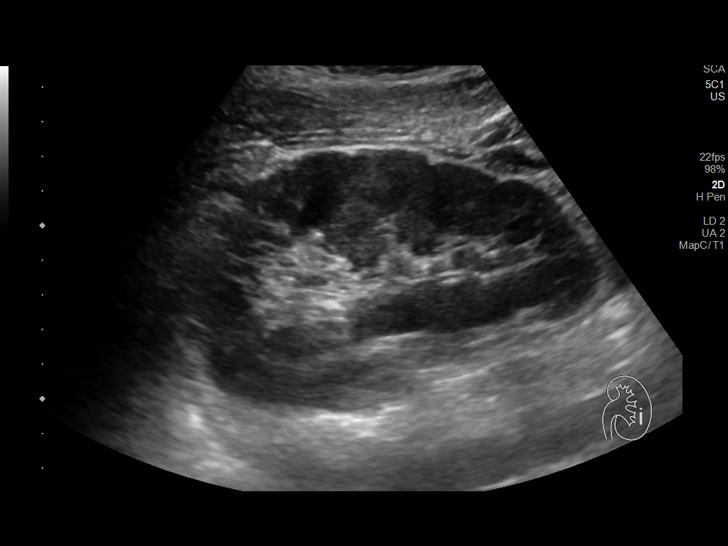
[im 57/63]
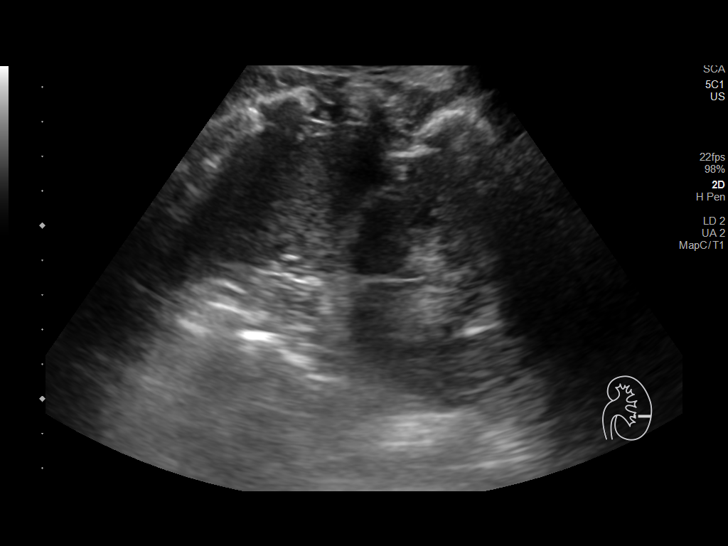
[im 63/63]
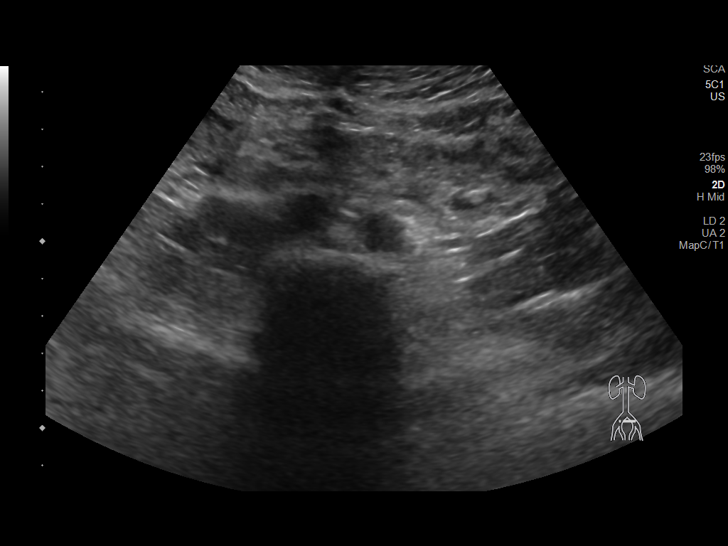

[14 of 25 positions shown; findings below may reference images not displayed]

FINDINGS: Gallbladder: Normally distended without stones or wall thickening.
No pericholecystic fluid or sonographic Murphy sign.

Common bile duct: Diameter: 4 mm, normal

Liver: Normal echogenicity without mass or nodularity. Portal vein
is patent on color Doppler imaging with normal direction of blood
flow towards the liver.

IVC: Normal appearance

Pancreas: Tail obscured by bowel gas. Visualized portion normal
appearance

Spleen: Normal appearance, 6.7 cm length

Right Kidney: Length: 11.9 cm. Normal morphology without mass or
hydronephrosis.

Left Kidney: Length: 11.4 cm. Normal morphology without mass or
hydronephrosis.

Abdominal aorta: Normal caliber

Other findings: No free fluid
IMPRESSION: No upper abdominal sonographic abnormalities identified.

Incomplete visualization of pancreatic tail.

## 2021-03-02 ENCOUNTER — Encounter: Payer: Self-pay | Admitting: Family Medicine

## 2021-03-02 MED ORDER — METOPROLOL TARTRATE 25 MG PO TABS: 25.0000 mg | ORAL_TABLET | Freq: Two times a day (BID) | ORAL | 1 refills | Status: DC | PRN

## 2021-06-05 ENCOUNTER — Encounter: Payer: 59 | Admitting: Family Medicine

## 2021-07-31 ENCOUNTER — Ambulatory Visit (INDEPENDENT_AMBULATORY_CARE_PROVIDER_SITE_OTHER): Payer: BC Managed Care – PPO | Admitting: Family Medicine

## 2021-07-31 ENCOUNTER — Encounter: Payer: Self-pay | Admitting: Family Medicine

## 2021-07-31 VITALS — BP 109/55 | HR 85 | Ht 77.0 in | Wt 189.0 lb

## 2021-07-31 DIAGNOSIS — Z23 Encounter for immunization: Secondary | ICD-10-CM | POA: Diagnosis not present

## 2021-07-31 DIAGNOSIS — Z0000 Encounter for general adult medical examination without abnormal findings: Secondary | ICD-10-CM

## 2021-07-31 LAB — COMPLETE METABOLIC PANEL WITH GFR
AG Ratio: 1.8 (calc) (ref 1.0–2.5)
ALT: 16 U/L (ref 9–46)
AST: 13 U/L (ref 10–40)
Albumin: 4.6 g/dL (ref 3.6–5.1)
Alkaline phosphatase (APISO): 40 U/L (ref 36–130)
BUN: 15 mg/dL (ref 7–25)
CO2: 30 mmol/L (ref 20–32)
Calcium: 9.6 mg/dL (ref 8.6–10.3)
Chloride: 104 mmol/L (ref 98–110)
Creat: 0.89 mg/dL (ref 0.60–1.29)
Globulin: 2.6 g/dL (calc) (ref 1.9–3.7)
Glucose, Bld: 83 mg/dL (ref 65–99)
Potassium: 4.6 mmol/L (ref 3.5–5.3)
Sodium: 140 mmol/L (ref 135–146)
Total Bilirubin: 0.7 mg/dL (ref 0.2–1.2)
Total Protein: 7.2 g/dL (ref 6.1–8.1)
eGFR: 108 mL/min/{1.73_m2} (ref >=60)

## 2021-07-31 LAB — CBC
HCT: 44.3 % (ref 38.5–50.0)
Hemoglobin: 14.4 g/dL (ref 13.2–17.1)
MCH: 28.1 pg (ref 27.0–33.0)
MCHC: 32.5 g/dL (ref 32.0–36.0)
MCV: 86.5 fL (ref 80.0–100.0)
MPV: 9.8 fL (ref 7.5–12.5)
Platelets: 241 10*3/uL (ref 140–400)
RBC: 5.12 10*6/uL (ref 4.20–5.80)
RDW: 12.5 % (ref 11.0–15.0)
WBC: 4 10*3/uL (ref 3.8–10.8)

## 2021-07-31 LAB — LIPID PANEL
Cholesterol: 167 mg/dL (ref <200)
HDL: 41 mg/dL (ref >=40)
LDL Cholesterol (Calc): 107 mg/dL (calc) — ABNORMAL HIGH
Non-HDL Cholesterol (Calc): 126 mg/dL (calc) (ref <130)
Total CHOL/HDL Ratio: 4.1 (calc) (ref <5.0)
Triglycerides: 92 mg/dL (ref <150)

## 2021-07-31 NOTE — Progress Notes (Signed)
Established Patient Office Visit  Subjective:  Patient ID: Robert Sexton, male    DOB: [birth year]  Age: 45 y.o. MRN: [id number]  CC:  Chief Complaint  Patient presents with   Annual Exam    HPI Robert Sexton presents for CPE. He is doing well overall.  He has his oldest child at [facility] and his youngest is 41 in high school.  He still exercises and feels like overall he tries to eat a healthy diet.  He denies any recent chest pain or shortness of breath.  Actually had a colonoscopy in 2020 for right lateral abdominal pain.  Past Medical History:  Diagnosis Date   PANIC ATTACK 10/05/2010   Qualifier: Diagnosis of  By: Madilyn Fireman MD, Barnetta Chapel      Past Surgical History:  Procedure Laterality Date   COLONOSCOPY  2006   WISDOM TOOTH EXTRACTION      Family History  Problem Relation Age of Onset   Atrial fibrillation Mother 65   Lymphoma Father 28   Colon polyps Sister        Pre-cancerous at age 53.    Colon cancer Neg Hx     Social History   Socioeconomic History   Marital status: Married    Spouse name: Not on file   Number of children: 2   Years of education: Not on file   Highest education level: Not on file  Occupational History   Not on file  Tobacco Use   Smoking status: Never   Smokeless tobacco: Never  Vaping Use   Vaping Use: Never used  Substance and Sexual Activity   Alcohol use: Yes    Alcohol/week: 2.0 standard drinks    Types: 2 Standard drinks or equivalent per week   Drug use: No   Sexual activity: Not on file  Other Topics Concern   Not on file  Social History Narrative   Play bball sometimes for exercise. Has 2 sons     Social Determinants of Radio broadcast assistant Strain: Not on file  Food Insecurity: Not on file  Transportation Needs: Not on file  Physical Activity: Not on file  Stress: Not on file  Social Connections: Not on file  Intimate Partner Violence: Not on file    Outpatient Medications Prior to Visit   Medication Sig Dispense Refill   metoprolol tartrate (LOPRESSOR) 25 MG tablet Take 1 tablet (25 mg total) by mouth 2 (two) times daily as needed. 60 tablet 1   Multiple Vitamin (MULTIVITAMIN) tablet Take 1 tablet by mouth daily.     No facility-administered medications prior to visit.    Allergies  Allergen Reactions   Penicillins Anaphylaxis    ROS Review of Systems    Objective:    Physical Exam Constitutional:      Appearance: He is well-developed.  HENT:     Head: Normocephalic and atraumatic.     Right Ear: External ear normal.     Left Ear: External ear normal.     Nose: Nose normal.  Eyes:     Conjunctiva/sclera: Conjunctivae normal.     Pupils: Pupils are equal, round, and reactive to light.  Neck:     Thyroid: No thyromegaly.  Cardiovascular:     Rate and Rhythm: Normal rate and regular rhythm.     Heart sounds: Normal heart sounds.  Pulmonary:     Effort: Pulmonary effort is normal.     Breath sounds: Normal breath sounds.  Abdominal:  General: Bowel sounds are normal. There is no distension.     Palpations: Abdomen is soft. There is no mass.     Tenderness: There is no abdominal tenderness. There is no guarding or rebound.  Musculoskeletal:        General: Normal range of motion.     Cervical back: Normal range of motion and neck supple.  Lymphadenopathy:     Cervical: No cervical adenopathy.  Skin:    General: Skin is warm and dry.  Neurological:     Mental Status: He is alert and oriented to person, place, and time.     Deep Tendon Reflexes: Reflexes are normal and symmetric.  Psychiatric:        Behavior: Behavior normal.        Thought Content: Thought content normal.        Judgment: Judgment normal.    BP (!) 109/55   Pulse 85   Ht '6\' 5"'$  (1.956 m)   Wt 189 lb (85.7 kg)   SpO2 100%   BMI 22.41 kg/m  Wt Readings from Last 3 Encounters:  07/31/21 189 lb (85.7 kg)  06/02/20 185 lb (83.9 kg)  05/27/19 195 lb (88.5 kg)     There  are no preventive care reminders to display for this patient.  There are no preventive care reminders to display for this patient.  Lab Results  Component Value Date   TSH 1.266 07/20/2014   Lab Results  Component Value Date   WBC 5.1 06/02/2020   HGB 14.4 06/02/2020   HCT 43.4 06/02/2020   MCV 86.3 06/02/2020   PLT 248 06/02/2020   Lab Results  Component Value Date   NA 141 06/02/2020   K 5.0 06/02/2020   CO2 28 06/02/2020   GLUCOSE 83 06/02/2020   BUN 13 06/02/2020   CREATININE 0.95 06/02/2020   BILITOT 0.5 06/02/2020   ALKPHOS 41 09/25/2016   AST 13 06/02/2020   ALT 16 06/02/2020   PROT 7.2 06/02/2020   ALBUMIN 4.3 09/25/2016   CALCIUM 9.8 06/02/2020   Lab Results  Component Value Date   CHOL 175 06/02/2020   Lab Results  Component Value Date   HDL 45 06/02/2020   Lab Results  Component Value Date   LDLCALC 115 (H) 06/02/2020   Lab Results  Component Value Date   TRIG 63 06/02/2020   Lab Results  Component Value Date   CHOLHDL 3.9 06/02/2020   No results found for: HGBA1C    Assessment & Plan:   Problem List Items Addressed This Visit   None Visit Diagnoses     Routine general medical examination at a health care facility    -  Primary   Relevant Orders   COMPLETE METABOLIC PANEL WITH GFR   Lipid panel   CBC   Need for immunization against influenza       Relevant Orders   Flu Vaccine QUAD 87moIM (Fluarix, Fluzone & Alfiuria Quad PF) (Completed)      Keep up a regular exercise program and make sure you are eating a healthy diet Try to eat 4 servings of dairy a day, or if you are lactose intolerant take a calcium with vitamin D daily.  Your vaccines are up to date.    No orders of the defined types were placed in this encounter.   Follow-up: Return in about 1 year (around 07/31/2022) for Wellness Exam.    CBeatrice Lecher MD

## 2021-08-01 NOTE — Progress Notes (Signed)
Perrin, overall cholesterol looks better this year.  Great work!  Other labs also look normal.

## 2022-02-09 ENCOUNTER — Other Ambulatory Visit: Payer: Self-pay | Admitting: Family Medicine

## 2022-03-06 ENCOUNTER — Other Ambulatory Visit: Payer: Self-pay | Admitting: Family Medicine

## 2022-03-16 ENCOUNTER — Encounter: Payer: Self-pay | Admitting: Family Medicine

## 2022-03-16 ENCOUNTER — Ambulatory Visit (INDEPENDENT_AMBULATORY_CARE_PROVIDER_SITE_OTHER): Payer: BC Managed Care – PPO | Admitting: Family Medicine

## 2022-03-16 VITALS — BP 118/70 | HR 89 | Resp 18 | Ht 77.0 in | Wt 195.0 lb

## 2022-03-16 DIAGNOSIS — I493 Ventricular premature depolarization: Secondary | ICD-10-CM

## 2022-03-16 DIAGNOSIS — R42 Dizziness and giddiness: Secondary | ICD-10-CM | POA: Diagnosis not present

## 2022-03-16 DIAGNOSIS — R002 Palpitations: Secondary | ICD-10-CM

## 2022-03-16 NOTE — Assessment & Plan Note (Signed)
Has had PVCs on and off for years but this was a little different and was associate with lightheadedness which she has not had happen before and has had a few more PVCs since then.  We discussed a couple of options for additional work-up we will start with some labs just to rule out electrolyte abnormalities, deficiencies anemia etc.  There are no specific triggers for this change.  It could be that his anxiety is increasing in general even though the situation itself was not very stressful but is still possible.  We discussed maybe getting an echocardiogram which she has never had just to make sure that structurally the heart looks good.  His symptoms do not sound consistent with coronary artery disease so I do not think that a stress test is warranted at this time.  We will continue to monitor for symptoms and if he has any recurrent lightheadedness then he will let us know. ?

## 2022-03-16 NOTE — Progress Notes (Signed)
? ?Acute Office Visit ? ?Subjective:  ? ?  ?Patient ID: Robert Sexton, male    DOB: February 28, 1976, 46 y.o.   MRN: [id number] ? ?Chief Complaint  ?Patient presents with  ? Heart Fluterring  ?  Lightheaded 4 days ago.   ? ? ?HPI ?Patient is in today for palpitations.  He has had palpitations on and off for years going back to before 2016.  He says that over the weekend he was at his son's basketball game and it was a pretty tense game but he started to feel anxious.  More than he is ever felt he says it was really a pretty intense and feeling.  But it did resolve.  Then on Monday he was on a Zoom call that was a pretty regular, nonstressful meeting and suddenly felt his palpitations.  They just felt a little bit more intense than usual and then he actually felt lightheaded afterwards.  He said he was able to turn the camera off and then eventually get up and go to the bathroom and put some cold waterand he felt better.  Has had a few more episodes of palpitations since then but not concomitant with the lightheadedness.  He denies any recent changes such as frequent use of NSAIDs, changing or increasing caffeine intake.  In fact he says he actually stopped drinking any caffeine since Monday when he had the episode while on the Zoom call.  No recent changes to medications or new supplements etc.  No recent fever or upper respiratory symptoms.  No diarrhea.  He did have a heart monitor 7 years ago which confirmed PVCs and PACs.  He does have a beta-blocker that he uses occasionally for stressful situations but does not take it routinely. ? ?ROS ? ? ?   ?Objective:  ?  ?BP 118/70   Pulse 89   Resp 18   Ht '6\' 5"'$  (1.956 m)   Wt 195 lb (88.5 kg)   SpO2 98%   BMI 23.12 kg/m?  ? ? ?Physical Exam ?Constitutional:   ?   Appearance: He is well-developed.  ?HENT:  ?   Head: Normocephalic and atraumatic.  ?Cardiovascular:  ?   Rate and Rhythm: Normal rate and regular rhythm.  ?   Heart sounds: Normal heart sounds.  ?Pulmonary:  ?    Effort: Pulmonary effort is normal.  ?   Breath sounds: Normal breath sounds.  ?Skin: ?   General: Skin is warm and dry.  ?Neurological:  ?   Mental Status: He is alert and oriented to person, place, and time.  ?Psychiatric:     ?   Behavior: Behavior normal.  ? ? ?No results found for any visits on 03/16/22. ? ? ?   ?Assessment & Plan:  ? ?Problem List Items Addressed This Visit   ? ?  ? Cardiovascular and Mediastinum  ? PVC's (premature ventricular contractions)  ?  Has had PVCs on and off for years but this was a little different and was associate with lightheadedness which she has not had happen before and has had a few more PVCs since then.  We discussed a couple of options for additional work-up we will start with some labs just to rule out electrolyte abnormalities, deficiencies anemia etc.  There are no specific triggers for this change.  It could be that his anxiety is increasing in general even though the situation itself was not very stressful but is still possible.  We discussed maybe getting an echocardiogram which  she has never had just to make sure that structurally the heart looks good.  His symptoms do not sound consistent with coronary artery disease so I do not think that a stress test is warranted at this time.  We will continue to monitor for symptoms and if he has any recurrent lightheadedness then he will let us know. ? ?  ?  ?  ? Other  ? Palpitations - Primary  ? Relevant Orders  ? EKG 12-Lead  ? COMPLETE METABOLIC PANEL WITH GFR  ? TSH  ? CBC  ? ECHOCARDIOGRAM COMPLETE  ? ?Other Visit Diagnoses   ? ? Lightheadedness      ? Relevant Orders  ? ECHOCARDIOGRAM COMPLETE  ? ?  ? ?EKG shows - 84 bpm, NSR with a PVC. No change from prior in 2018.  ? ?No orders of the defined types were placed in this encounter. ? ? ?Return if symptoms worsen or fail to improve. ? ?Beatrice Lecher, MD ? ? ?

## 2022-03-17 LAB — CBC
HCT: 43.5 % (ref 38.5–50.0)
Hemoglobin: 14.6 g/dL (ref 13.2–17.1)
MCH: 29.2 pg (ref 27.0–33.0)
MCHC: 33.6 g/dL (ref 32.0–36.0)
MCV: 87 fL (ref 80.0–100.0)
MPV: 9.9 fL (ref 7.5–12.5)
Platelets: 247 10*3/uL (ref 140–400)
RBC: 5 10*6/uL (ref 4.20–5.80)
RDW: 12.5 % (ref 11.0–15.0)
WBC: 5.5 10*3/uL (ref 3.8–10.8)

## 2022-03-17 LAB — COMPLETE METABOLIC PANEL WITH GFR
AG Ratio: 1.6 (calc) (ref 1.0–2.5)
ALT: 16 U/L (ref 9–46)
AST: 13 U/L (ref 10–40)
Albumin: 4.4 g/dL (ref 3.6–5.1)
Alkaline phosphatase (APISO): 41 U/L (ref 36–130)
BUN: 12 mg/dL (ref 7–25)
CO2: 30 mmol/L (ref 20–32)
Calcium: 9.6 mg/dL (ref 8.6–10.3)
Chloride: 103 mmol/L (ref 98–110)
Creat: 0.88 mg/dL (ref 0.60–1.29)
Globulin: 2.7 g/dL (calc) (ref 1.9–3.7)
Glucose, Bld: 93 mg/dL (ref 65–99)
Potassium: 4.4 mmol/L (ref 3.5–5.3)
Sodium: 140 mmol/L (ref 135–146)
Total Bilirubin: 0.5 mg/dL (ref 0.2–1.2)
Total Protein: 7.1 g/dL (ref 6.1–8.1)
eGFR: 108 mL/min/{1.73_m2} (ref >=60)

## 2022-03-17 LAB — TSH: TSH: 0.79 mIU/L (ref 0.40–4.50)

## 2022-03-19 NOTE — Progress Notes (Signed)
Your lab work is within acceptable range and there are no concerning findings.   ?

## 2022-04-02 ENCOUNTER — Ambulatory Visit (HOSPITAL_BASED_OUTPATIENT_CLINIC_OR_DEPARTMENT_OTHER)
Admission: RE | Admit: 2022-04-02 | Discharge: 2022-04-02 | Disposition: A | Discharge status: Home / Self Care | Payer: BC Managed Care – PPO | Source: Ambulatory Visit | Attending: Family Medicine | Admitting: Family Medicine

## 2022-04-02 DIAGNOSIS — R42 Dizziness and giddiness: Secondary | ICD-10-CM | POA: Insufficient documentation

## 2022-04-02 DIAGNOSIS — R002 Palpitations: Secondary | ICD-10-CM | POA: Insufficient documentation

## 2022-04-02 LAB — ECHOCARDIOGRAM COMPLETE
AR max vel: 3.08 cm2
AV Area VTI: 3.1 cm2
AV Area mean vel: 2.93 cm2
AV Mean grad: 2 mmHg
AV Peak grad: 4 mmHg
Ao pk vel: 1 m/s
Area-P 1/2: 4.71 cm2
S' Lateral: 3.6 cm

## 2022-04-02 NOTE — Progress Notes (Signed)
Hi Araceli,  ?Echocardiogram shows normal pumping function of the heart which is reassuring.  No motion abnormalities which is also normal and reassuring.  The mitral valve looks good the aortic valve looks good. Nothing structural to explain the lightheadedness or palpitations.

## 2022-04-02 NOTE — Progress Notes (Signed)
?  Echocardiogram ?2D Echocardiogram has been performed. ? ?Robert Sexton ?04/02/2022, 12:20 PM ?

## 2022-08-01 ENCOUNTER — Encounter: Payer: Self-pay | Admitting: Family Medicine

## 2022-08-27 ENCOUNTER — Encounter: Payer: Self-pay | Admitting: Family Medicine

## 2022-08-27 ENCOUNTER — Ambulatory Visit (INDEPENDENT_AMBULATORY_CARE_PROVIDER_SITE_OTHER): Payer: BC Managed Care – PPO | Admitting: Family Medicine

## 2022-08-27 VITALS — BP 111/65 | HR 87 | Ht 77.0 in | Wt 193.0 lb

## 2022-08-27 DIAGNOSIS — Z23 Encounter for immunization: Secondary | ICD-10-CM | POA: Diagnosis not present

## 2022-08-27 DIAGNOSIS — L989 Disorder of the skin and subcutaneous tissue, unspecified: Secondary | ICD-10-CM

## 2022-08-27 DIAGNOSIS — Z0000 Encounter for general adult medical examination without abnormal findings: Secondary | ICD-10-CM

## 2022-08-27 DIAGNOSIS — Z13220 Encounter for screening for lipoid disorders: Secondary | ICD-10-CM | POA: Diagnosis not present

## 2022-08-27 NOTE — Progress Notes (Signed)
Complete physical exam  Patient: Robert Sexton   DOB: 1976-07-21   46 y.o. Male  MRN: [id number]  Subjective:    Chief Complaint  Patient presents with   Annual Exam    Pt states last anuual exam had some heart palipitations and these are continuing . Would also like to have some "spots" on face checked today     Robert Sexton is a 46 y.o. male who presents today for a complete physical exam. He reports consuming a general diet.  Walking and basketball 3 days pwer week   He generally feels well. He reports sleeping fairly well. He does not have additional problems to discuss today.   He does not have a few spots on his face that he would like me to take a look at today.  Most recent fall risk assessment:    08/27/2022    8:33 AM  [place] in the past year? 0  Number falls in past yr: 0  Injury with Fall? 0  Risk for fall due to : No Fall Risks  Follow up Falls evaluation completed     Most recent depression screenings:    08/27/2022    8:33 AM 03/16/2022    1:45 PM  PHQ 2/9 Scores  PHQ - 2 Score 0 0  PHQ- 9 Score  1      Past Medical History:  Diagnosis Date   PANIC ATTACK 10/05/2010   Qualifier: Diagnosis of  By: Madilyn Fireman MD, Barnetta Chapel     Past Surgical History:  Procedure Laterality Date   COLONOSCOPY  2006   WISDOM TOOTH EXTRACTION     Allergies  Allergen Reactions   Penicillins Anaphylaxis      Patient Care Team: Hali Marry, MD as PCP - General   Outpatient Medications Prior to Visit  Medication Sig   metoprolol tartrate (LOPRESSOR) 25 MG tablet TAKE 1 TABLET BY MOUTH TWICE A DAY AS NEEDED   Multiple Vitamin (MULTIVITAMIN) tablet Take 1 tablet by mouth daily.   No facility-administered medications prior to visit.    ROS        Objective:     BP 111/65   Pulse 87   Ht '6\' 5"'$  (1.956 m)   Wt 193 lb (87.5 kg)   SpO2 99%   BMI 22.89 kg/m    Physical Exam   No results found for any visits on 08/27/22.     Assessment  & Plan:    Routine Health Maintenance and Physical Exam  Immunization History  Administered Date(s) Administered   Hepatitis A 05/29/2007   Hepatitis B 05/29/2007   Influenza, Quadrivalent, Recombinant, Inj, Pf 09/21/2018   Influenza,inj,Quad PF,6+ Mos 07/20/2014, 09/19/2015, 09/25/2016, 08/21/2017, 07/31/2021, 08/27/2022   Moderna Sars-Covid-2 Vaccination 03/03/2020, 03/31/2020   Td 04/21/2007   Tdap 05/29/2007, 08/21/2017    Health Maintenance  Topic Date Due   COVID-19 Vaccine (3 - Moderna risk series) 08/17/2023 (Originally 04/28/2020)   COLONOSCOPY (Pts 45-18yr Insurance coverage will need to be confirmed)  05/26/2024   TETANUS/TDAP  08/22/2027   INFLUENZA VACCINE  Completed   Hepatitis C Screening  Completed   HIV Screening  Completed   Pneumococcal Vaccine [id number] old  Aged Out   HPV VACCINES  Aged Out    Discussed health benefits of physical activity, and encouraged him to engage in regular exercise appropriate for his age and condition.  Problem List Items Addressed This Visit   None Visit Diagnoses  Wellness examination    -  Primary   Relevant Orders   Lipid Panel w/reflex Direct LDL   BASIC METABOLIC PANEL WITH GFR   Lipid screening       Relevant Orders   Lipid Panel w/reflex Direct LDL   BASIC METABOLIC PANEL WITH GFR   Need for immunization against influenza       Relevant Orders   Flu Vaccine QUAD 36moIM (Fluarix, Fluzone & Alfiuria Quad PF) (Completed)   Skin lesion       Relevant Orders   Ambulatory referral to Dermatology       Keep up a regular exercise program and make sure you are eating a healthy diet Try to eat 4 servings of dairy a day, or if you are lactose intolerant take a calcium with vitamin D daily.  Your vaccines are up to date.   No follow-ups on file.     CBeatrice Lecher MD

## 2022-08-28 LAB — BASIC METABOLIC PANEL WITH GFR
BUN: 16 mg/dL (ref 7–25)
CO2: 29 mmol/L (ref 20–32)
Calcium: 9.4 mg/dL (ref 8.6–10.3)
Chloride: 104 mmol/L (ref 98–110)
Creat: 0.86 mg/dL (ref 0.60–1.29)
Glucose, Bld: 87 mg/dL (ref 65–99)
Potassium: 4.8 mmol/L (ref 3.5–5.3)
Sodium: 140 mmol/L (ref 135–146)
eGFR: 108 mL/min/{1.73_m2} (ref >=60)

## 2022-08-28 LAB — LIPID PANEL W/REFLEX DIRECT LDL
Cholesterol: 177 mg/dL (ref <200)
HDL: 48 mg/dL (ref >=40)
LDL Cholesterol (Calc): 109 mg/dL (calc) — ABNORMAL HIGH
Non-HDL Cholesterol (Calc): 129 mg/dL (calc) (ref <130)
Total CHOL/HDL Ratio: 3.7 (calc) (ref <5.0)
Triglycerides: 108 mg/dL (ref <150)

## 2022-08-28 NOTE — Progress Notes (Signed)
Hi Robert Sexton, your LDL cholesterol is just borderline.  Continue to work on [employer] and regular exercise.  Metabolic panel looks great.

## 2022-09-05 DIAGNOSIS — L739 Follicular disorder, unspecified: Secondary | ICD-10-CM | POA: Diagnosis not present

## 2022-09-05 DIAGNOSIS — L821 Other seborrheic keratosis: Secondary | ICD-10-CM | POA: Diagnosis not present

## 2023-05-03 ENCOUNTER — Other Ambulatory Visit: Payer: Self-pay | Admitting: Family Medicine

## 2023-05-21 ENCOUNTER — Telehealth: Payer: Self-pay | Admitting: Family Medicine

## 2023-05-21 NOTE — Telephone Encounter (Signed)
Patient called stating he will be traveling to [place] from 06/22/2023 to 06/29/2023.  Patient wanted to know if he could be prescribed something for stomach issues such as travelers diarrhea before he leaves.

## 2023-05-22 NOTE — Telephone Encounter (Signed)
Will fwd to Dr. Linford Arnold for medication.

## 2023-05-22 NOTE — Telephone Encounter (Signed)
Spoke w/ pt appt scheduled.

## 2023-05-22 NOTE — Telephone Encounter (Signed)
I would recommend he make a virtual appointment.

## 2023-06-04 ENCOUNTER — Telehealth: Payer: BC Managed Care – PPO | Admitting: Family Medicine

## 2023-06-04 DIAGNOSIS — F40243 Fear of flying: Secondary | ICD-10-CM | POA: Diagnosis not present

## 2023-06-04 DIAGNOSIS — Z7184 Encounter for health counseling related to travel: Secondary | ICD-10-CM | POA: Diagnosis not present

## 2023-06-04 MED ORDER — ALPRAZOLAM 0.25 MG PO TABS: 0.2500 mg | ORAL_TABLET | Freq: Every day | ORAL | 0 refills | Status: DC | PRN

## 2023-06-04 NOTE — Progress Notes (Signed)
Pt is going to be traveling to [place]. He is leaving on 8/3 and returning 8/10.  [place], to [place]. He did reach out to passport health and was told that   He did receive Hep A/B, Typhoid, Tdap, Malaria tablet and Azithromycin.     He has questions about Cholera because one of his coworkers did get that immunization, and if he could get something for traveling to take should he become anxious or nauseated.

## 2023-06-04 NOTE — Progress Notes (Signed)
    Virtual Visit via Video Note  I connected with Robert Sexton on 06/04/23 at  1:00 PM EDT by a video enabled telemedicine application and verified that I am speaking with the correct person using two identifiers.   I discussed the limitations of evaluation and management by telemedicine and the availability of in person appointments. The patient expressed understanding and agreed to proceed.  Patient location: at home Provider location: in office  Subjective:    CC:   Chief Complaint  Patient presents with   Follow-up         HPI:    Pt is going to be traveling to [place]. He is leaving on 8/3 and returning 8/10.   [place], to [place]. He did reach out to passport health and was told that    He did receive Hep A/B, Typhoid, Tdap, Malaria tablet and Azithromycin.      He has questions about Cholera because one of his coworkers did get that immunization, and if he could get something for traveling to take should he become anxious on the air plane        Past medical history, Surgical history, Family history not pertinant except as noted below, Social history, Allergies, and medications have been entered into the medical record, reviewed, and corrections made.    Objective:    General: Speaking clearly in complete sentences without any shortness of breath.  Alert and oriented x3.  Normal judgment. No apparent acute distress.    Impression and Recommendations:    Problem List Items Addressed This Visit   None Visit Diagnoses     Travel advice encounter    -  Primary   Anxiety with flying       Relevant Medications   ALPRAZolam (XANAX) 0.25 MG tablet      Travel advice - reviewed current info from [facility]. No need for cholera or yellow fever.  He is up-to-date.  Hopefully they will forward [place] a copy of his recent vaccinations and will get this updated in our record.  We did discuss using alprazolam as needed on the flight for anxiety or to help with sleep.   Prescription sent to pharmacy.  Discussed potential side effects.  Use sparingly.  Call if any problems or concern.  Already has a Z-Pak for as needed traveler's diarrhea.  No orders of the defined types were placed in this encounter.  I spent 20 minutes on the day of the encounter to include pre-visit record review, face-to-face time with the patient and post visit ordering of test.  Meds ordered this encounter  Medications   ALPRAZolam (XANAX) 0.25 MG tablet    Sig: Take 1-2 tablets (0.25-0.5 mg total) by mouth daily as needed for anxiety. For flying    Dispense:  10 tablet    Refill:  0     I discussed the assessment and treatment plan with the patient. The patient was provided an opportunity to ask questions and all were answered. The patient agreed with the plan and demonstrated an understanding of the instructions.   The patient was advised to call back or seek an in-person evaluation if the symptoms worsen or if the condition fails to improve as anticipated.   Nani Gasser, MD

## 2023-08-29 ENCOUNTER — Encounter: Payer: BC Managed Care – PPO | Admitting: Family Medicine

## 2023-10-08 ENCOUNTER — Encounter: Payer: BC Managed Care – PPO | Admitting: Family Medicine

## 2023-11-06 ENCOUNTER — Ambulatory Visit (INDEPENDENT_AMBULATORY_CARE_PROVIDER_SITE_OTHER): Payer: BC Managed Care – PPO | Admitting: Family Medicine

## 2023-11-06 ENCOUNTER — Encounter: Payer: Self-pay | Admitting: Family Medicine

## 2023-11-06 VITALS — BP 107/57 | HR 88 | Ht 77.0 in | Wt 189.0 lb

## 2023-11-06 DIAGNOSIS — Z23 Encounter for immunization: Secondary | ICD-10-CM | POA: Diagnosis not present

## 2023-11-06 DIAGNOSIS — Z0000 Encounter for general adult medical examination without abnormal findings: Secondary | ICD-10-CM

## 2023-11-06 MED ORDER — METOPROLOL TARTRATE 25 MG PO TABS: 25.0000 mg | ORAL_TABLET | Freq: Two times a day (BID) | ORAL | 0 refills | Status: AC | PRN

## 2023-11-06 NOTE — Progress Notes (Signed)
Complete physical exam  Patient: Robert Sexton   DOB: 1976-04-20   47 y.o. Male  MRN: [id number]  Subjective:    Chief Complaint  Patient presents with   Annual Exam    Luvern Finkbiner is a 47 y.o. male who presents today for a complete physical exam. He reports consuming a general diet.  Some exercise and resistance training   He generally feels well. He reports sleeping fairly well. He does not have additional problems to discuss today.    Most recent fall risk assessment:    08/27/2022    9:23 AM  Fall Risk   Falls in the past year? 0  Number falls in past yr: 0  Injury with Fall? 0  Risk for fall due to : No Fall Risks  Follow up Falls evaluation completed     Most recent depression screenings:    08/27/2022    9:22 AM 08/27/2022    8:33 AM  PHQ 2/9 Scores  PHQ - 2 Score 0 0  PHQ- 9 Score 3          Patient Care Team: Agapito Games, MD as PCP - General   Outpatient Medications Prior to Visit  Medication Sig   Multiple Vitamin (MULTIVITAMIN) tablet Take 1 tablet by mouth daily.   [DISCONTINUED] ALPRAZolam (XANAX) 0.25 MG tablet Take 1-2 tablets (0.25-0.5 mg total) by mouth daily as needed for anxiety. For flying   [DISCONTINUED] atovaquone-proguanil (MALARONE) 250-100 MG TABS tablet PLEASE SEE ATTACHED FOR DETAILED DIRECTIONS   [DISCONTINUED] azithromycin (ZITHROMAX) 500 MG tablet Take 500 mg by mouth daily as needed.   [DISCONTINUED] metoprolol tartrate (LOPRESSOR) 25 MG tablet TAKE 1 TABLET BY MOUTH TWICE A DAY AS NEEDED   No facility-administered medications prior to visit.    ROS        Objective:     BP (!) 107/57   Pulse 88   Ht 6\' 5"  (1.956 m)   Wt 189 lb (85.7 kg)   SpO2 100%   BMI 22.41 kg/m     Physical Exam Constitutional:      Appearance: Normal appearance.  HENT:     Head: Normocephalic and atraumatic.     Right Ear: Tympanic membrane, ear canal and external ear normal.     Left Ear: Tympanic membrane, ear canal and  external ear normal.     Nose: Nose normal.     Mouth/Throat:     Pharynx: Oropharynx is clear.  Eyes:     Extraocular Movements: Extraocular movements intact.     Conjunctiva/sclera: Conjunctivae normal.     Pupils: Pupils are equal, round, and reactive to light.  Neck:     Thyroid: No thyromegaly.  Cardiovascular:     Rate and Rhythm: Normal rate and regular rhythm.  Pulmonary:     Effort: Pulmonary effort is normal.     Breath sounds: Normal breath sounds.  Abdominal:     General: Bowel sounds are normal.     Palpations: Abdomen is soft.     Tenderness: There is no abdominal tenderness.  Musculoskeletal:        General: No swelling.     Cervical back: Neck supple.  Skin:    General: Skin is warm and dry.  Neurological:     Mental Status: He is oriented to person, place, and time.  Psychiatric:        Mood and Affect: Mood normal.        Behavior: Behavior normal.  No results found for any visits on 11/06/23.      Assessment & Plan:    Routine Health Maintenance and Physical Exam  Immunization History  Administered Date(s) Administered   Hepatitis A 05/29/2007   Hepatitis B 05/29/2007   Influenza, Quadrivalent, Recombinant, Inj, Pf 09/21/2018   Influenza, Seasonal, Injecte, Preservative Fre 11/06/2023   Influenza,inj,Quad PF,6+ Mos 07/20/2014, 09/19/2015, 09/25/2016, 08/21/2017, 07/31/2021, 08/27/2022   Moderna Sars-Covid-2 Vaccination 03/03/2020, 03/31/2020   Td 04/21/2007   Tdap 05/29/2007, 08/21/2017    Health Maintenance  Topic Date Due   COVID-19 Vaccine (3 - Moderna risk series) 04/28/2020   Colonoscopy  05/26/2024   DTaP/Tdap/Td (4 - Td or Tdap) 08/22/2027   INFLUENZA VACCINE  Completed   Hepatitis C Screening  Completed   HIV Screening  Completed   HPV VACCINES  Aged Out    Discussed health benefits of physical activity, and encouraged him to engage in regular exercise appropriate for his age and condition.  Problem List Items Addressed  This Visit   None Visit Diagnoses       Wellness examination    -  Primary   Relevant Orders   CMP14+EGFR   Lipid panel   CBC     Encounter for immunization       Relevant Orders   Flu vaccine trivalent PF, 6mos and older(Flulaval,Afluria,Fluarix,Fluzone) (Completed)       Keep up a regular exercise program and make sure you are eating a healthy diet Try to eat 4 servings of dairy a day, or if you are lactose intolerant take a calcium with vitamin D daily.  Your vaccines are up to date.   Return in about 1 year (around 11/05/2024) for Wellness Exam.     Nani Gasser, MD

## 2023-11-07 LAB — CMP14+EGFR
ALT: 17 [IU]/L (ref 0–44)
AST: 16 [IU]/L (ref 0–40)
Albumin: 4.3 g/dL (ref 4.1–5.1)
Alkaline Phosphatase: 47 [IU]/L (ref 44–121)
BUN/Creatinine Ratio: 16 (ref 9–20)
BUN: 15 mg/dL (ref 6–24)
Bilirubin Total: 0.6 mg/dL (ref 0.0–1.2)
CO2: 25 mmol/L (ref 20–29)
Calcium: 9.8 mg/dL (ref 8.7–10.2)
Chloride: 101 mmol/L (ref 96–106)
Creatinine, Ser: 0.96 mg/dL (ref 0.76–1.27)
Globulin, Total: 2.5 g/dL (ref 1.5–4.5)
Glucose: 91 mg/dL (ref 70–99)
Potassium: 4.5 mmol/L (ref 3.5–5.2)
Sodium: 138 mmol/L (ref 134–144)
Total Protein: 6.8 g/dL (ref 6.0–8.5)
eGFR: 98 mL/min/{1.73_m2} (ref >59)

## 2023-11-07 LAB — CBC
Hematocrit: 44.4 % (ref 37.5–51.0)
Hemoglobin: 14.4 g/dL (ref 13.0–17.7)
MCH: 28.6 pg (ref 26.6–33.0)
MCHC: 32.4 g/dL (ref 31.5–35.7)
MCV: 88 fL (ref 79–97)
Platelets: 251 10*3/uL (ref 150–450)
RBC: 5.04 x10E6/uL (ref 4.14–5.80)
RDW: 12.7 % (ref 11.6–15.4)
WBC: 5 10*3/uL (ref 3.4–10.8)

## 2023-11-07 LAB — LIPID PANEL
Chol/HDL Ratio: 3.5 {ratio} (ref 0.0–5.0)
Cholesterol, Total: 177 mg/dL (ref 100–199)
HDL: 50 mg/dL (ref >39)
LDL Chol Calc (NIH): 111 mg/dL — ABNORMAL HIGH (ref 0–99)
Triglycerides: 85 mg/dL (ref 0–149)
VLDL Cholesterol Cal: 16 mg/dL (ref 5–40)

## 2023-11-07 NOTE — Progress Notes (Signed)
[place], LDL is just mildly elevated continue to work on healthy diet and regular exercise.  Blood count metabolic panel look great.  The 10-year ASCVD risk score (Arnett DK, et al., 2019) is: 1.5%   Values used to calculate the score:     Age: 47 years     Sex: Male     Is Non-Hispanic African American: No     Diabetic: No     Tobacco smoker: No     Systolic Blood Pressure: 107 mmHg     Is BP treated: No     HDL Cholesterol: 50 mg/dL     Total Cholesterol: 177 mg/dL

## 2024-07-03 ENCOUNTER — Encounter: Payer: Self-pay | Admitting: Family Medicine

## 2024-07-03 DIAGNOSIS — A09 Infectious gastroenteritis and colitis, unspecified: Secondary | ICD-10-CM

## 2024-07-03 DIAGNOSIS — F40243 Fear of flying: Secondary | ICD-10-CM

## 2024-07-03 MED ORDER — ALPRAZOLAM 0.25 MG PO TABS: 0.2500 mg | ORAL_TABLET | Freq: Every day | ORAL | 0 refills | Status: AC | PRN

## 2024-07-03 MED ORDER — AZITHROMYCIN 500 MG PO TABS: 500.0000 mg | ORAL_TABLET | Freq: Every day | ORAL | 0 refills | Status: AC | PRN

## 2024-07-03 NOTE — Telephone Encounter (Signed)
 Meds ordered this encounter  Medications   ALPRAZolam  (XANAX ) 0.25 MG tablet    Sig: Take 1-2 tablets (0.25-0.5 mg total) by mouth daily as needed for anxiety. For flying    Dispense:  10 tablet    Refill:  0   azithromycin  (ZITHROMAX ) 500 MG tablet    Sig: Take 1 tablet (500 mg total) by mouth daily as needed.    Dispense:  5 tablet    Refill:  0

## 2024-11-05 ENCOUNTER — Encounter: Payer: BC Managed Care – PPO | Admitting: Family Medicine
